# Patient Record
Sex: Female | Born: 1950 | Race: Black or African American | Hispanic: No | Marital: Married | Smoking: Former smoker
Health system: Southern US, Community
[De-identification: ages and names within clinical notes are randomized; demographics above are authoritative.]

## PROBLEM LIST (undated history)

## (undated) DIAGNOSIS — R55 Syncope and collapse: Secondary | ICD-10-CM

## (undated) DIAGNOSIS — Z87898 Personal history of other specified conditions: Secondary | ICD-10-CM

## (undated) DIAGNOSIS — Q211 Atrial septal defect, unspecified: Secondary | ICD-10-CM

## (undated) HISTORY — PX: ABDOMINAL HYSTERECTOMY: SHX81

## (undated) HISTORY — DX: Syncope and collapse: R55

## (undated) HISTORY — DX: Atrial septal defect, unspecified: Q21.10

## (undated) HISTORY — DX: Personal history of other specified conditions: Z87.898

## (undated) HISTORY — DX: Atrial septal defect: Q21.1

## (undated) HISTORY — PX: NECK SURGERY: SHX720

---

## 1997-08-14 ENCOUNTER — Ambulatory Visit (HOSPITAL_COMMUNITY): Admission: RE | Admit: 1997-08-14 | Discharge: 1997-08-14 | Payer: Self-pay | Admitting: Obstetrics and Gynecology

## 1997-12-01 ENCOUNTER — Inpatient Hospital Stay (HOSPITAL_COMMUNITY): Admission: AD | Admit: 1997-12-01 | Discharge: 1997-12-01 | Payer: Self-pay | Admitting: Obstetrics and Gynecology

## 1997-12-31 ENCOUNTER — Inpatient Hospital Stay (HOSPITAL_COMMUNITY): Admission: RE | Admit: 1997-12-31 | Discharge: 1997-12-31 | Payer: Self-pay | Admitting: Obstetrics and Gynecology

## 1998-01-31 ENCOUNTER — Encounter (HOSPITAL_COMMUNITY): Admission: RE | Admit: 1998-01-31 | Discharge: 1998-03-11 | Payer: Self-pay | Admitting: Obstetrics and Gynecology

## 1998-03-10 ENCOUNTER — Observation Stay (HOSPITAL_COMMUNITY): Admission: RE | Admit: 1998-03-10 | Discharge: 1998-03-11 | Payer: Self-pay | Admitting: Obstetrics and Gynecology

## 1999-01-12 ENCOUNTER — Ambulatory Visit (HOSPITAL_COMMUNITY): Admission: RE | Admit: 1999-01-12 | Discharge: 1999-01-12 | Payer: Self-pay | Admitting: Family Medicine

## 1999-01-14 ENCOUNTER — Encounter: Payer: Self-pay | Admitting: Family Medicine

## 1999-01-14 ENCOUNTER — Ambulatory Visit (HOSPITAL_COMMUNITY): Admission: RE | Admit: 1999-01-14 | Discharge: 1999-01-14 | Payer: Self-pay | Admitting: Family Medicine

## 1999-06-19 ENCOUNTER — Encounter: Payer: Self-pay | Admitting: Family Medicine

## 1999-06-19 ENCOUNTER — Encounter: Admission: RE | Admit: 1999-06-19 | Discharge: 1999-06-19 | Payer: Self-pay | Admitting: Family Medicine

## 2000-01-25 ENCOUNTER — Ambulatory Visit (HOSPITAL_COMMUNITY): Admission: RE | Admit: 2000-01-25 | Discharge: 2000-01-25 | Payer: Self-pay | Admitting: Family Medicine

## 2000-01-25 ENCOUNTER — Encounter: Payer: Self-pay | Admitting: Family Medicine

## 2000-12-15 ENCOUNTER — Encounter: Payer: Self-pay | Admitting: Family Medicine

## 2000-12-15 ENCOUNTER — Encounter: Admission: RE | Admit: 2000-12-15 | Discharge: 2000-12-15 | Payer: Self-pay | Admitting: Family Medicine

## 2001-11-02 ENCOUNTER — Ambulatory Visit (HOSPITAL_COMMUNITY): Admission: RE | Admit: 2001-11-02 | Discharge: 2001-11-02 | Payer: Self-pay | Admitting: Family Medicine

## 2001-11-02 ENCOUNTER — Encounter: Payer: Self-pay | Admitting: Family Medicine

## 2003-04-08 ENCOUNTER — Ambulatory Visit (HOSPITAL_COMMUNITY): Admission: RE | Admit: 2003-04-08 | Discharge: 2003-04-08 | Payer: Self-pay | Admitting: Family Medicine

## 2003-04-08 ENCOUNTER — Encounter: Payer: Self-pay | Admitting: Family Medicine

## 2004-06-02 ENCOUNTER — Ambulatory Visit (HOSPITAL_COMMUNITY): Admission: RE | Admit: 2004-06-02 | Discharge: 2004-06-02 | Payer: Self-pay | Admitting: Family Medicine

## 2004-10-07 ENCOUNTER — Emergency Department (HOSPITAL_COMMUNITY): Admission: EM | Admit: 2004-10-07 | Discharge: 2004-10-07 | Payer: Self-pay | Admitting: Emergency Medicine

## 2004-10-15 ENCOUNTER — Encounter: Admission: RE | Admit: 2004-10-15 | Discharge: 2004-10-15 | Payer: Self-pay | Admitting: Family Medicine

## 2004-10-26 ENCOUNTER — Encounter: Admission: RE | Admit: 2004-10-26 | Discharge: 2004-10-26 | Payer: Self-pay | Admitting: Family Medicine

## 2004-11-09 ENCOUNTER — Inpatient Hospital Stay (HOSPITAL_COMMUNITY): Admission: RE | Admit: 2004-11-09 | Discharge: 2004-11-10 | Payer: Self-pay | Admitting: Neurosurgery

## 2005-06-14 ENCOUNTER — Ambulatory Visit (HOSPITAL_COMMUNITY): Admission: RE | Admit: 2005-06-14 | Discharge: 2005-06-14 | Payer: Self-pay | Admitting: Family Medicine

## 2005-10-27 ENCOUNTER — Encounter: Payer: Self-pay | Admitting: Family Medicine

## 2005-11-11 ENCOUNTER — Encounter: Admission: RE | Admit: 2005-11-11 | Discharge: 2005-11-11 | Payer: Self-pay | Admitting: Family Medicine

## 2006-07-13 ENCOUNTER — Encounter: Admission: RE | Admit: 2006-07-13 | Discharge: 2006-07-13 | Payer: Self-pay | Admitting: Family Medicine

## 2007-02-02 IMAGING — US UNKNOWN US STUDY
1 series · 6 of 6 positions shown · non-contrast
Comparison: none

DG DIAGNOSTIC UNILATERAL L
CC and MLO view(s) were taken of the left breast.

LEFT BREAST ULTRASOUND
DIGITAL UNILATERAL LEFT DIAGNOSTIC MAMMOGRAM AND LEFT BREAST ULTRASOUND:
CLINICAL DATA: 54-year-old with numerous lumps palpated in the 4 o'clock position of the left 
breast.

[Series 1: unknown us study · 6 of 6 slices shown]
[im 1/6]
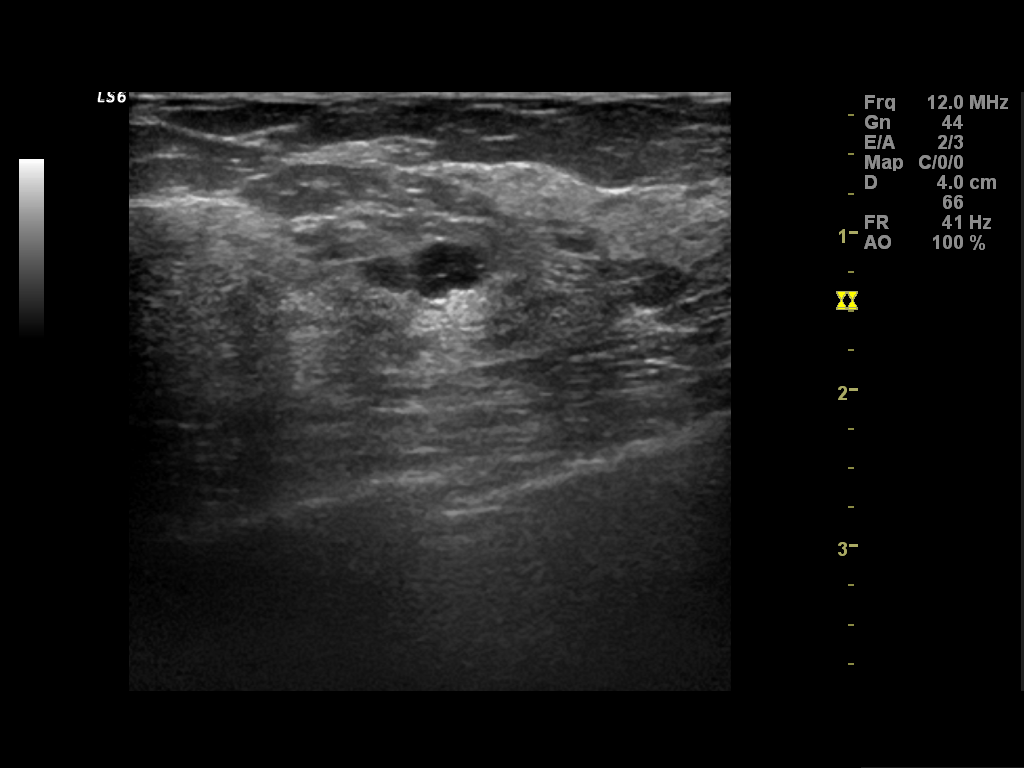
[im 2/6]
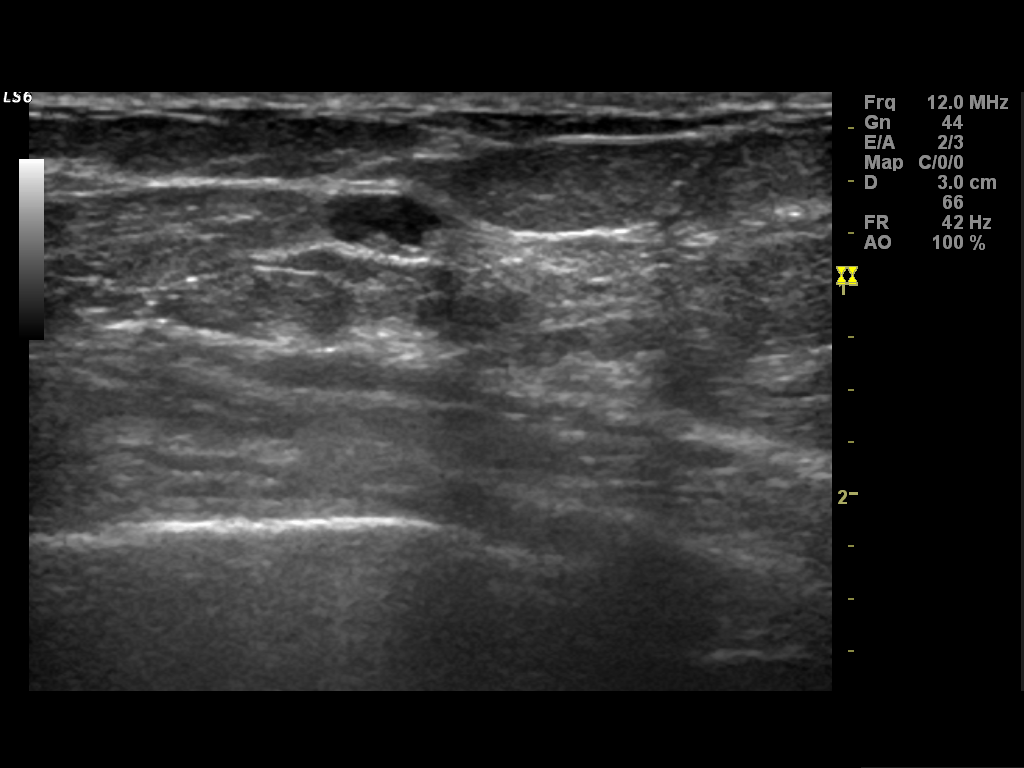
[im 3/6]
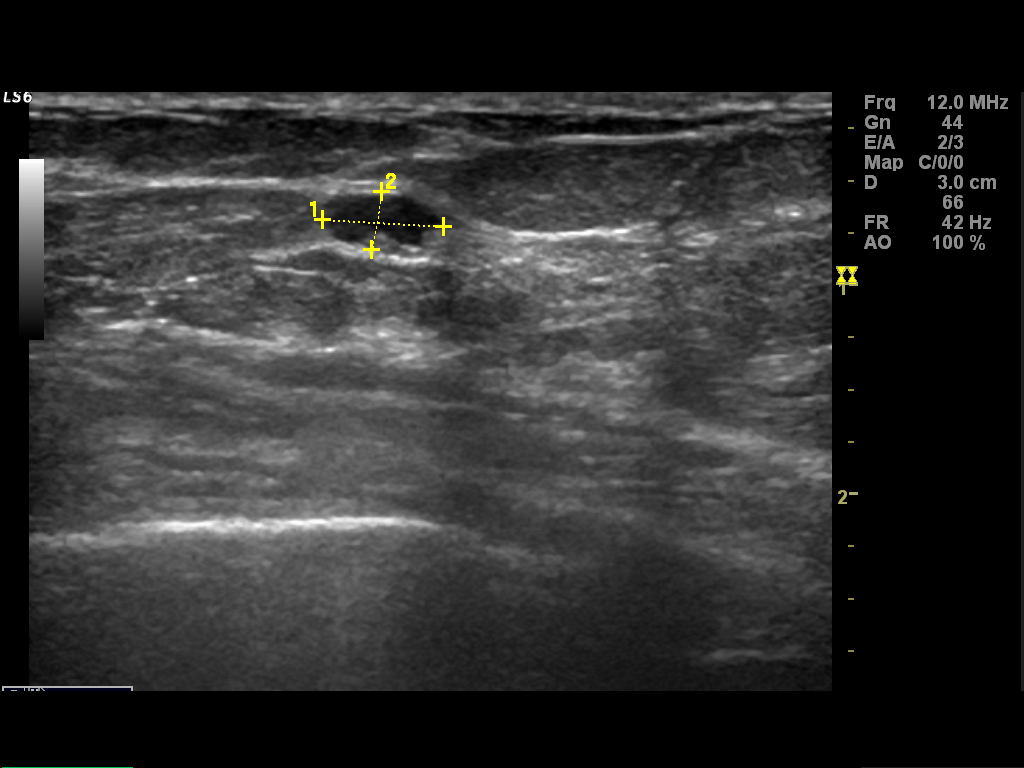
[im 4/6]
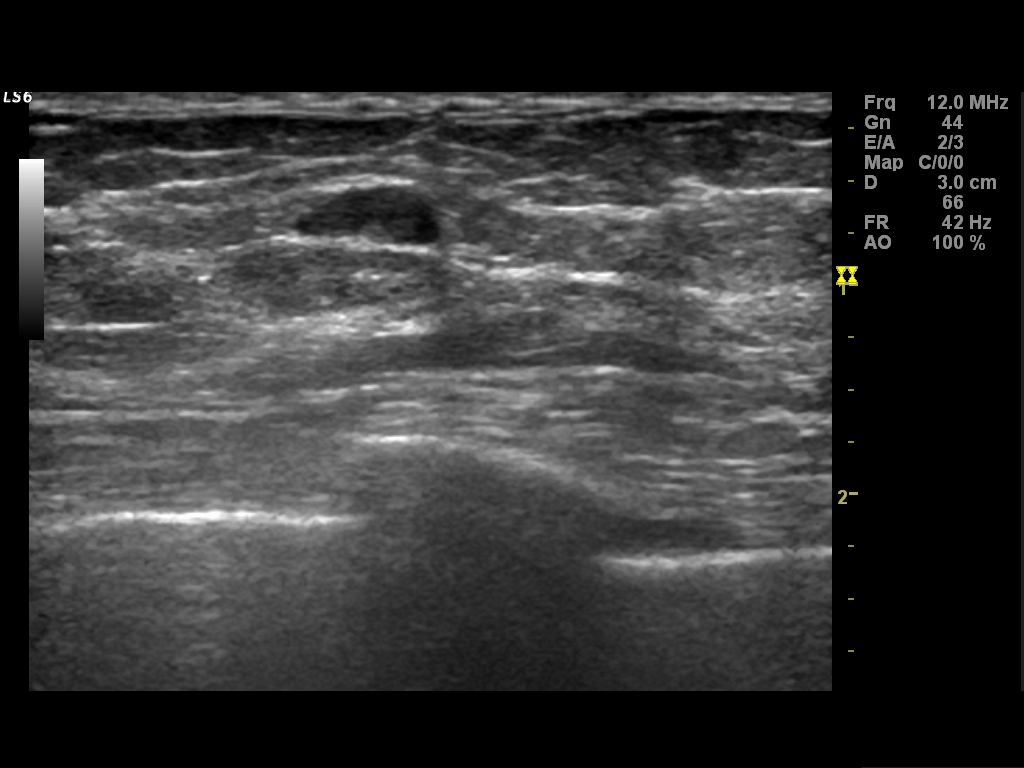
[im 5/6]
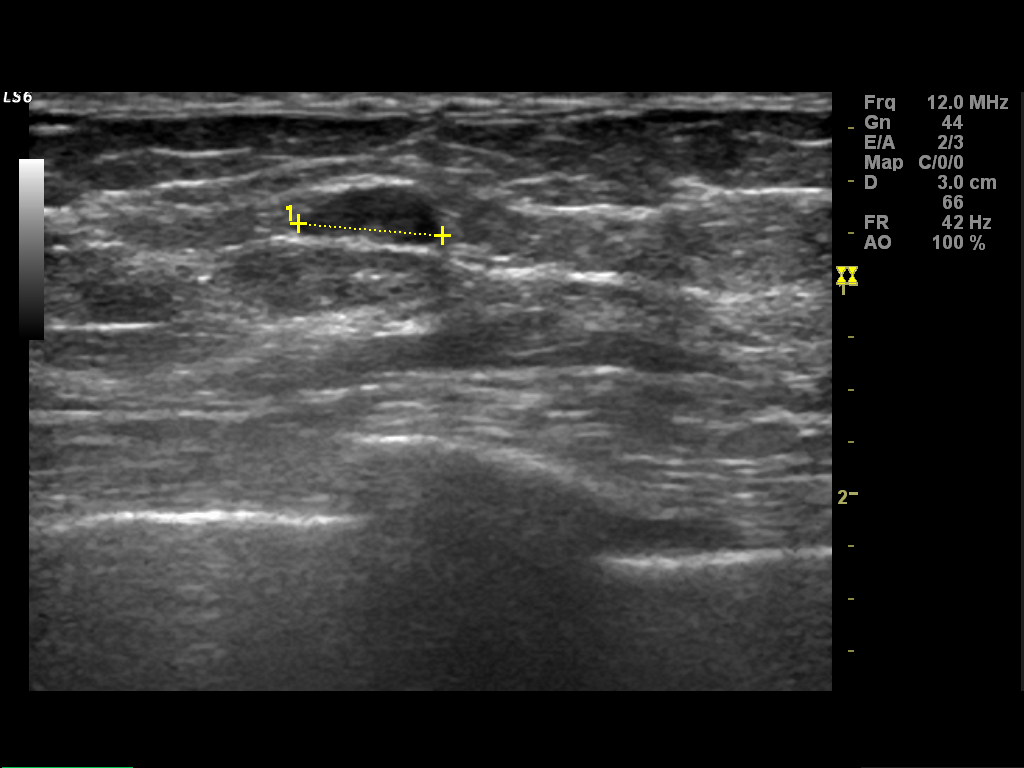
[im 6/6]
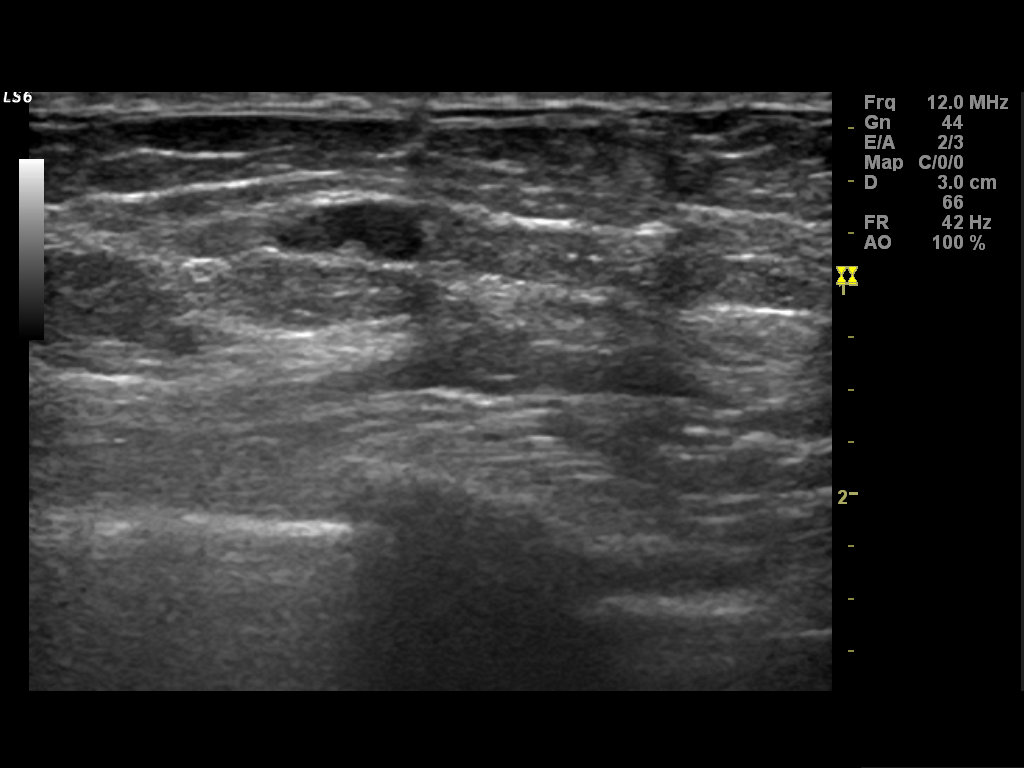

[6 of 6 positions shown; findings below may reference images not displayed]

Comparison 06-02-04.  Breast parenchyma is extremely dense.  A small rounded nodule is seen in the 
medial portion of the left breast, confirmed with spot compression views.  Scattered 
benign-appearing calcifications are noted.

On physical exam, there is soft thickening in the lateral quadrants of the left breast.  I do not 
palpate a discrete abnormality today.  Ultrasound is performed of the lateral quadrants of the left
breast showing normal-appearing fibroglandular parenchyma. Ultrasound is also performed of the 
medial quadrants of the left breast showing a well-defined hypoechoic nodule in the 10 o'clock 
position 7 cm from the nipple measuring 6 x 3 x 7 mm.  This has a small echogenic center and likely
represents an intramammary lymph node.  This correlates well with the mammographic abnormality.   
No suspicious findings are identified.
IMPRESSION: No ultrasound or mammographic evidence for malignancy.  Annual mammography is recommended.  Next 
screening mammogram is suggested in May 2006.

ASSESSMENT: Benign - BI-RADS 2

Screening mammogram of both breasts in 7 months.
,

## 2007-02-02 IMAGING — MG MM DIAGNOSTIC UNILATERAL L
5 series · 5 of 5 positions shown · non-contrast
Comparison: none

DG DIAGNOSTIC UNILATERAL L
CC and MLO view(s) were taken of the left breast.

LEFT BREAST ULTRASOUND
DIGITAL UNILATERAL LEFT DIAGNOSTIC MAMMOGRAM AND LEFT BREAST ULTRASOUND:
CLINICAL DATA: 54-year-old with numerous lumps palpated in the 4 o'clock position of the left 
breast.

[L CC (1 of 2)]
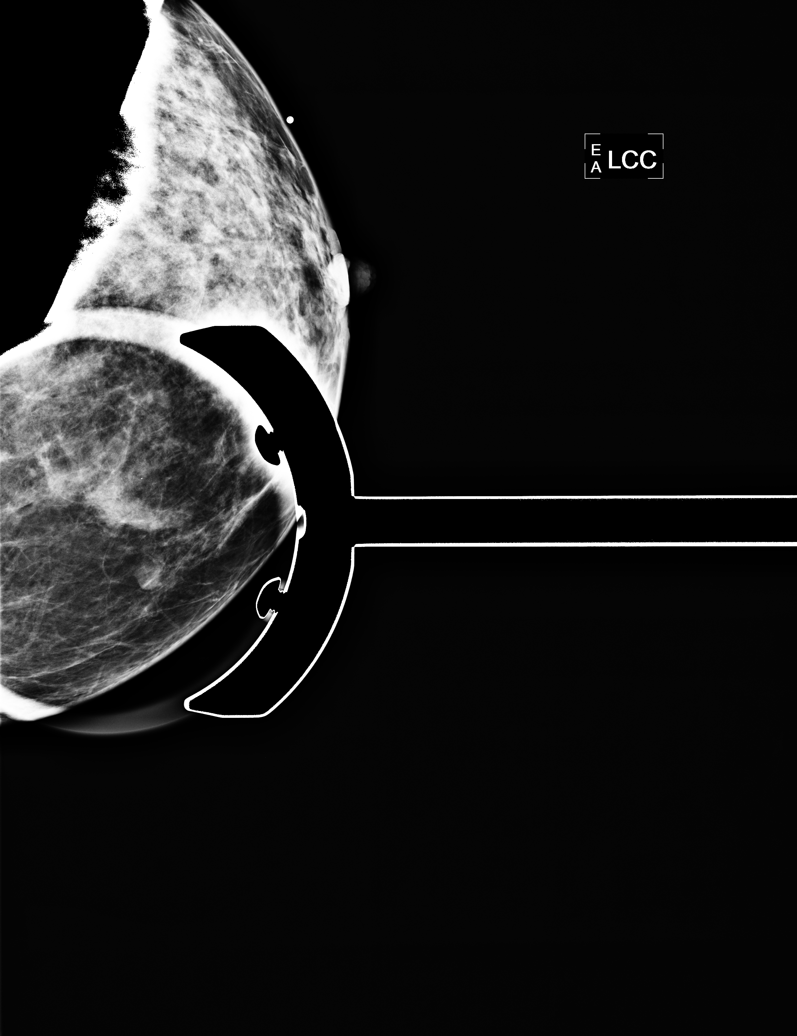

[L CC (2 of 2)]
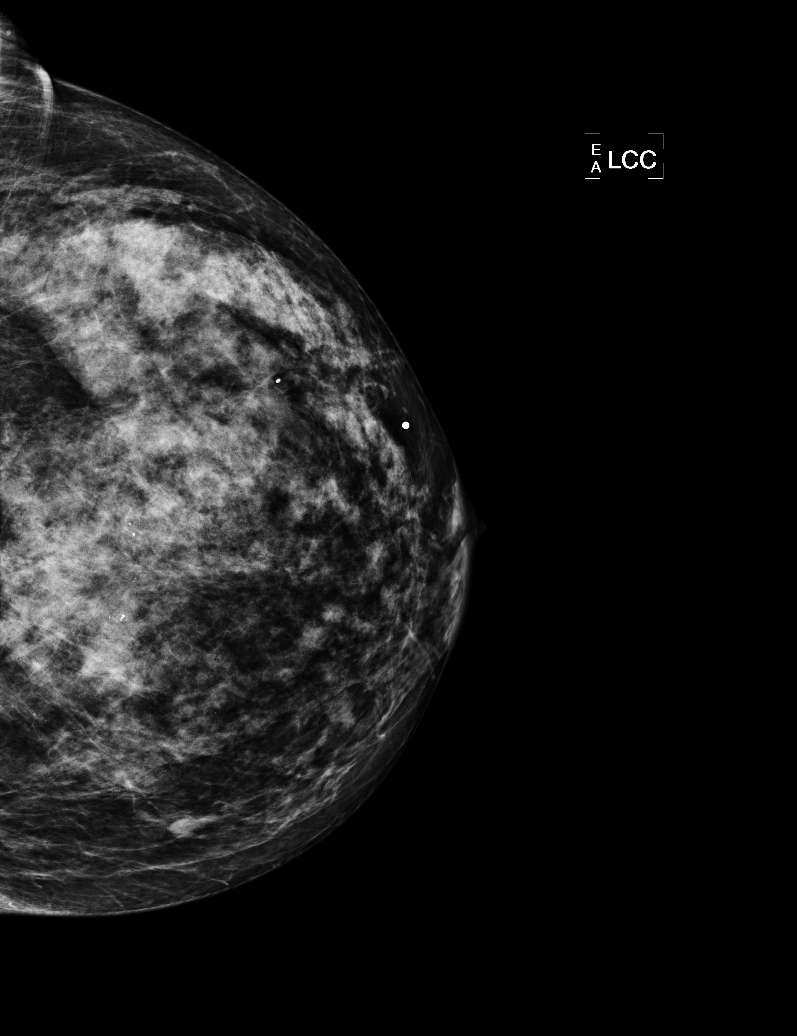

[L MLO]
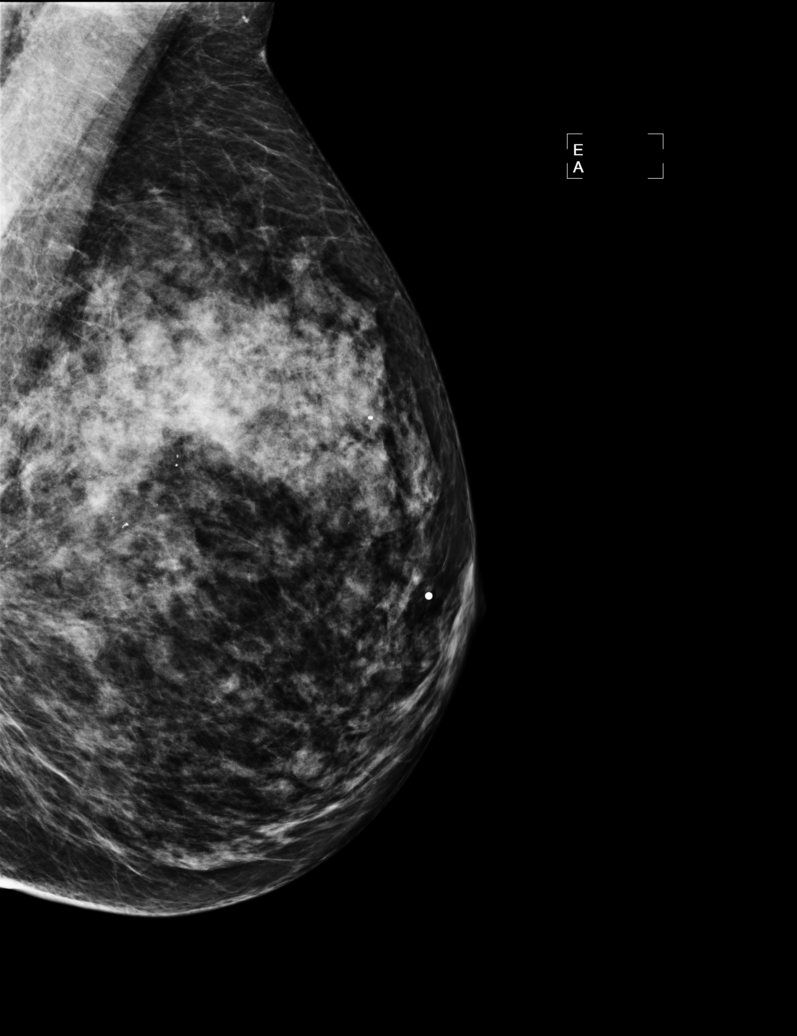

[L TAN]
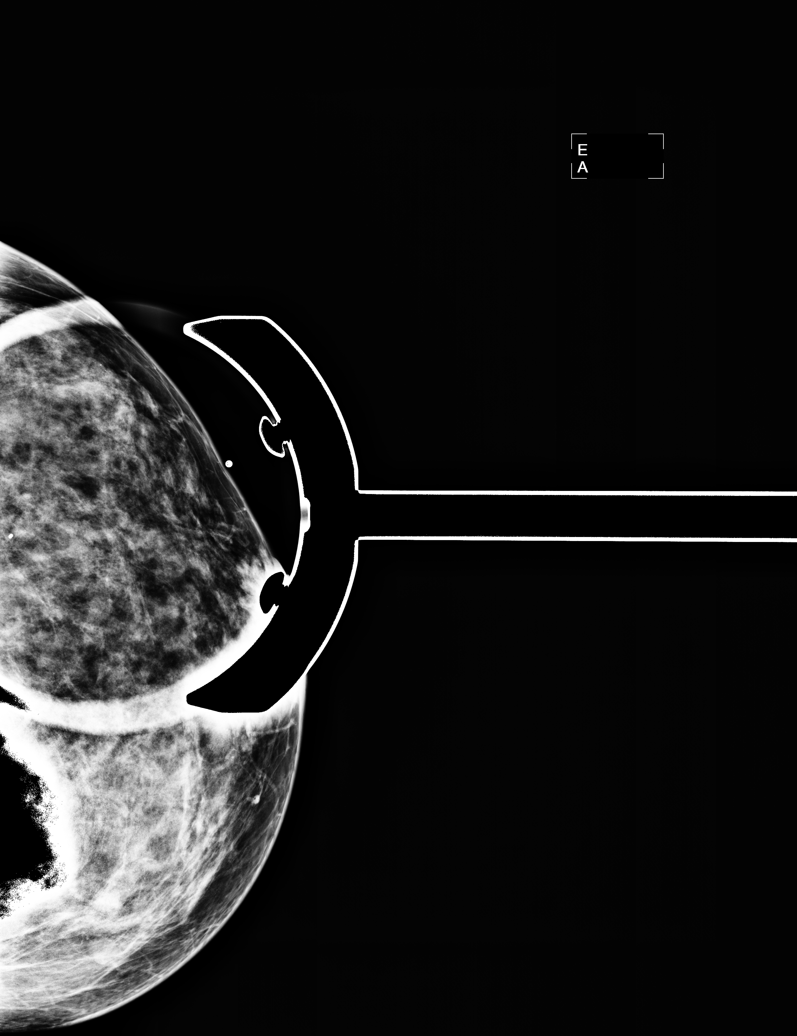

[L ML]
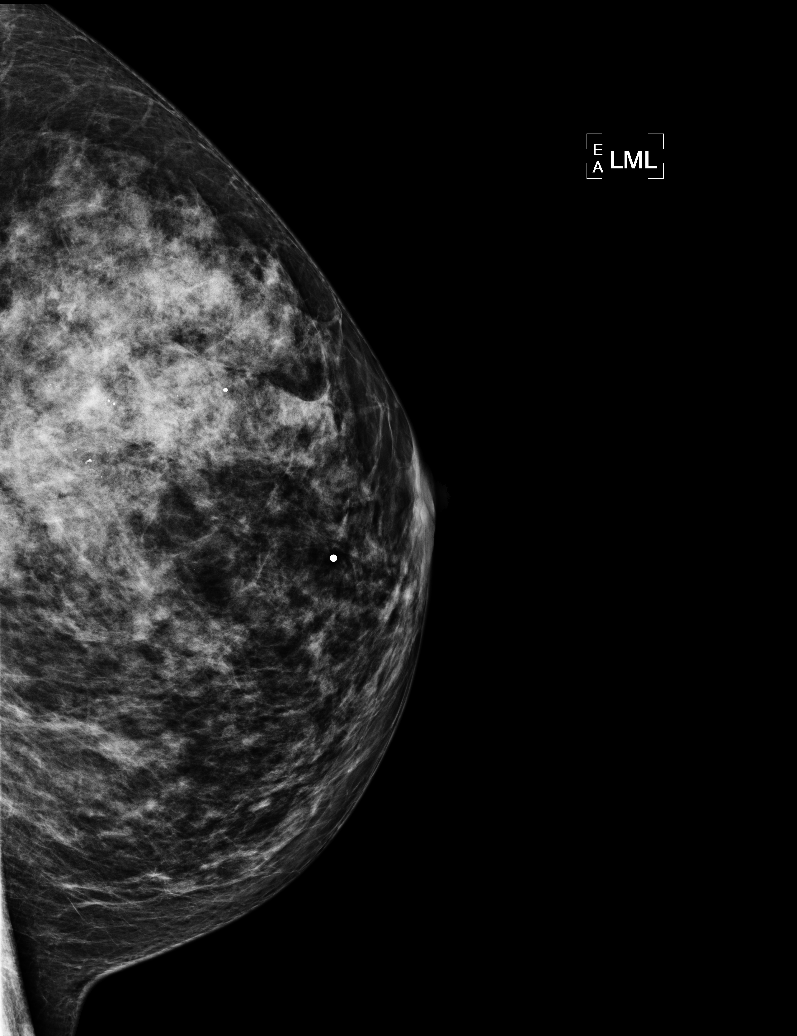

[5 of 5 positions shown; findings below may reference images not displayed]

Comparison 06-02-04.  Breast parenchyma is extremely dense.  A small rounded nodule is seen in the 
medial portion of the left breast, confirmed with spot compression views.  Scattered 
benign-appearing calcifications are noted.

On physical exam, there is soft thickening in the lateral quadrants of the left breast.  I do not 
palpate a discrete abnormality today.  Ultrasound is performed of the lateral quadrants of the left
breast showing normal-appearing fibroglandular parenchyma. Ultrasound is also performed of the 
medial quadrants of the left breast showing a well-defined hypoechoic nodule in the 10 o'clock 
position 7 cm from the nipple measuring 6 x 3 x 7 mm.  This has a small echogenic center and likely
represents an intramammary lymph node.  This correlates well with the mammographic abnormality.   
No suspicious findings are identified.
IMPRESSION: No ultrasound or mammographic evidence for malignancy.  Annual mammography is recommended.  Next 
screening mammogram is suggested in May 2006.

ASSESSMENT: Benign - BI-RADS 2

Screening mammogram of both breasts in 7 months.
,

## 2007-07-31 ENCOUNTER — Ambulatory Visit (HOSPITAL_COMMUNITY): Admission: RE | Admit: 2007-07-31 | Discharge: 2007-07-31 | Payer: Self-pay | Admitting: Family Medicine

## 2008-08-26 ENCOUNTER — Encounter: Admission: RE | Admit: 2008-08-26 | Discharge: 2008-08-26 | Payer: Self-pay | Admitting: Family Medicine

## 2009-05-15 ENCOUNTER — Emergency Department (HOSPITAL_COMMUNITY): Admission: EM | Admit: 2009-05-15 | Discharge: 2009-05-15 | Payer: Self-pay | Admitting: Emergency Medicine

## 2009-10-09 ENCOUNTER — Encounter: Admission: RE | Admit: 2009-10-09 | Discharge: 2009-10-09 | Payer: Self-pay | Admitting: Family Medicine

## 2010-07-07 ENCOUNTER — Encounter
Admission: RE | Admit: 2010-07-07 | Discharge: 2010-07-07 | Payer: Self-pay | Source: Home / Self Care | Attending: Family Medicine | Admitting: Family Medicine

## 2010-07-19 ENCOUNTER — Encounter: Payer: Self-pay | Admitting: Family Medicine

## 2010-09-30 LAB — RAPID STREP SCREEN (MED CTR MEBANE ONLY): Streptococcus, Group A Screen (Direct): NEGATIVE

## 2010-09-30 LAB — CBC
Hemoglobin: 12.1 g/dL (ref 12.0–15.0)
RDW: 12.3 % (ref 11.5–15.5)

## 2010-09-30 LAB — DIFFERENTIAL
Basophils Absolute: 0 10*3/uL (ref 0.0–0.1)
Lymphocytes Relative: 36 % (ref 12–46)
Monocytes Absolute: 0.5 10*3/uL (ref 0.1–1.0)
Neutro Abs: 3.4 10*3/uL (ref 1.7–7.7)

## 2010-09-30 LAB — BASIC METABOLIC PANEL
Calcium: 9.4 mg/dL (ref 8.4–10.5)
GFR calc Af Amer: >60 mL/min (ref >60)
GFR calc non Af Amer: >60 mL/min (ref >60)
Glucose, Bld: 90 mg/dL (ref 70–99)
Sodium: 139 mEq/L (ref 135–145)

## 2010-09-30 LAB — POCT CARDIAC MARKERS
CKMB, poc: 1.5 ng/mL (ref 1.0–8.0)
Troponin i, poc: <0.05 ng/mL (ref 0.00–0.09)

## 2010-10-15 ENCOUNTER — Other Ambulatory Visit: Payer: Self-pay | Admitting: Family Medicine

## 2010-10-15 DIAGNOSIS — Z1231 Encounter for screening mammogram for malignant neoplasm of breast: Secondary | ICD-10-CM

## 2010-10-26 ENCOUNTER — Ambulatory Visit
Admission: RE | Admit: 2010-10-26 | Discharge: 2010-10-26 | Disposition: A | Discharge status: Home / Self Care | Payer: BC Managed Care – PPO | Source: Ambulatory Visit | Attending: Family Medicine | Admitting: Family Medicine

## 2010-10-26 DIAGNOSIS — Z1231 Encounter for screening mammogram for malignant neoplasm of breast: Secondary | ICD-10-CM

## 2010-11-13 NOTE — Op Note (Signed)
NAMEMALON, SIDDALL NO.:  [id number]   MEDICAL RECORD NO.:  [id number]          PATIENT TYPE:  INP   LOCATION:  2899                         FACILITY:  [facility]   PHYSICIAN:  Clydene Fake, M.D.  DATE OF BIRTH:  08-15-1950   DATE OF PROCEDURE:  11/09/2004  DATE OF DISCHARGE:                                 OPERATIVE REPORT   PREOPERATIVE DIAGNOSIS:  Spondylosis, C5-6 and C6-7, with left-sided  radiculopathy.   POSTOPERATIVE DIAGNOSIS:  Spondylosis, C5-6 and C6-7, with left-sided  radiculopathy.   PROCEDURE:  Anterior cervical decompression diskectomy and fusion, C5-6 and  C6-7, with placement of allograft bone and Eagle anterior cervical plate.   SURGEON:  Clydene Fake, M.D.   ASSISTANT:  __________   ANESTHESIA:  General endotracheal tube anesthesia.   ESTIMATED BLOOD LOSS:  150 mL.   DRAINS:  None.   COMPLICATIONS:  None.   REASON FOR CONSULTATION:  The patient is a 60 year old woman who presented  with neck and left arm pain, numbness, and weakness.  This started about a  month ago.  The pain radiates to the left arm, triceps to the middle finger  with numbness in the middle finger.  She was found to have weakness in the  left triceps and maybe a little atrophy there.  The patient's MRI was done  showing spondylitic changes of 5-6 and 6-7 with foraminal narrowing of 6 on  the left with foraminal narrowing and disk herniation of the foramen at 6-7.  The patient was brought in for decompression and fusion.   DESCRIPTION OF PROCEDURE:  The patient was brought into the operating room.  General anesthesia was induced.  The patient was placed in 10 pounds halter  traction and prepped and draped in a sterile fashion.  The site of incision  was injected with 10 mL of 1% lidocaine with epinephrine.  The incision was  then made from the midline to the anterior border of the sternocleidomastoid  muscle on the left side of the neck.  Incision was taken to  the platysma,  and the platysma was incised with the Bovie.  Blunt dissection was taken  through the anterior cervical fascia to the anterior cervical spine.  The  needle was placed in the interspace and x-ray was obtained which showed this  was in the 5-6 interspace.  The disk space was incised, and partial  diskectomy was performed.  As the needle was removed, the longus colli  muscle was reflected laterally on each side over the 5, 6, and 7 vertebral  bodies.  The self-retaining retractor was replaced, and the 5-6 and 6-7 disk  spaces were then incised with the 15-blade, and diskectomy was performed.  With pituitary rongeurs and curets, distraction pins were placed in the C5  and C7 interspaces and distracted.  The microscope was brought in for  microdissection, and continuing with the curets and 1 and 2-mm Kerrison  punches, disk continued to be removed.  Posterior ligament and osteophytes  were removed with the Kerrison punches, and bilateral foraminotomies were  performed at both 5-6 and 6-7  levels.  There was some brief disk herniation  into the foramen on the left side at C6-7.  When we were finished, we had  viewed decompression of the central canal and foramen with decompression of  the nerve roots bilaterally at both levels.  Hemostasis was obtained with  Gelfoam and thrombin, and then Gelfoam was irrigated out.  We did have a  little oozing from the foramen on the left at 6-7, and a small piece of  Gelfoam was left in that corner.  We used a high-speed drill to remove  cartilage endplate to remove anterior osteophytes and measured the  interspace to be 5 mm each.  A 5-mm __________ to bone was tapped into 5-6  and another one into 6-7 and countersunk about 1 mm.  We checked posterior  to the graft, and there was plenty of room between the bone graft and the  dura.  It was irrigated with Keflex solution.  The distraction pins were  removed.  Hemostasis was obtained with Gelfoam  and thrombin.  The weight was  removed from the traction.  The arm was in good position.  The Eagle  anterior cervical plate was placed over the anterior cervical spine and 2  screws placed in C5, 2 in C6, and 2 in C7.  These were tightened down, and  lateral x-rays were obtained showing good position of plate and screws and  interbody bone at 5-6 and 6-7 levels.  Retractors were removed.  Hemostasis  was obtained with Gelfoam, thrombin, and bipolar cauterization.  Gelfoam was  irrigated out using antibiotic solution.  We had good hemostasis, and the  platysma was closed with 3-0 Vicryl interrupted sutures.  The subcutaneous  tissues was closed with the same.  The skin was closed with benzoin and  Steri-Strips.  Dressing was placed.  The patient was placed in a soft  cervical collar, awoke from anesthesia, and transferred to the recovery room  in stable condition.      JRH/MEDQ  D:  11/09/2004  T:  11/09/2004  Job:  [id number]

## 2011-10-27 ENCOUNTER — Other Ambulatory Visit: Payer: Self-pay | Admitting: Family Medicine

## 2011-10-27 DIAGNOSIS — Z1231 Encounter for screening mammogram for malignant neoplasm of breast: Secondary | ICD-10-CM

## 2011-11-08 ENCOUNTER — Ambulatory Visit
Admission: RE | Admit: 2011-11-08 | Discharge: 2011-11-08 | Disposition: A | Discharge status: Home / Self Care | Payer: BC Managed Care – PPO | Source: Ambulatory Visit | Attending: Family Medicine | Admitting: Family Medicine

## 2011-11-08 DIAGNOSIS — Z1231 Encounter for screening mammogram for malignant neoplasm of breast: Secondary | ICD-10-CM

## 2012-06-16 ENCOUNTER — Other Ambulatory Visit: Payer: Self-pay | Admitting: Physician Assistant

## 2012-06-16 DIAGNOSIS — M79609 Pain in unspecified limb: Secondary | ICD-10-CM

## 2012-06-19 ENCOUNTER — Ambulatory Visit
Admission: RE | Admit: 2012-06-19 | Discharge: 2012-06-19 | Disposition: A | Discharge status: Home / Self Care | Payer: BC Managed Care – PPO | Source: Ambulatory Visit | Attending: Physician Assistant | Admitting: Physician Assistant

## 2012-06-19 DIAGNOSIS — M79609 Pain in unspecified limb: Secondary | ICD-10-CM

## 2012-11-27 ENCOUNTER — Other Ambulatory Visit: Payer: Self-pay | Admitting: *Deleted

## 2012-11-27 DIAGNOSIS — I83893 Varicose veins of bilateral lower extremities with other complications: Secondary | ICD-10-CM

## 2012-12-18 ENCOUNTER — Encounter: Payer: Self-pay | Admitting: Vascular Surgery

## 2012-12-18 ENCOUNTER — Other Ambulatory Visit: Payer: Self-pay

## 2012-12-18 DIAGNOSIS — Z1231 Encounter for screening mammogram for malignant neoplasm of breast: Secondary | ICD-10-CM

## 2012-12-19 ENCOUNTER — Encounter: Payer: Self-pay | Admitting: Vascular Surgery

## 2012-12-19 ENCOUNTER — Ambulatory Visit (INDEPENDENT_AMBULATORY_CARE_PROVIDER_SITE_OTHER): Payer: BC Managed Care – PPO | Admitting: Vascular Surgery

## 2012-12-19 ENCOUNTER — Encounter (INDEPENDENT_AMBULATORY_CARE_PROVIDER_SITE_OTHER): Payer: BC Managed Care – PPO | Admitting: Vascular Surgery

## 2012-12-19 ENCOUNTER — Other Ambulatory Visit: Payer: Self-pay

## 2012-12-19 VITALS — BP 147/71 | HR 66 | Resp 16 | Ht 65.5 in | Wt 120.0 lb

## 2012-12-19 DIAGNOSIS — M79609 Pain in unspecified limb: Secondary | ICD-10-CM

## 2012-12-19 DIAGNOSIS — I83893 Varicose veins of bilateral lower extremities with other complications: Secondary | ICD-10-CM

## 2012-12-19 DIAGNOSIS — I868 Varicose veins of other specified sites: Secondary | ICD-10-CM

## 2012-12-19 DIAGNOSIS — I8390 Asymptomatic varicose veins of unspecified lower extremity: Secondary | ICD-10-CM

## 2012-12-19 NOTE — Progress Notes (Signed)
Subjective:     Patient ID: Morgan Bright, female   DOB: [birth year], 62 y.o.   MRN: [id number]  HPI this 62 year old healthy female has noticed some spider veins in both lower extremities. This is associated with stinging discomfort. She will occasionally have swelling in the ankle. She has no history of DVT and thrombophlebitis. She does not wear elastic compression stockings were had history of stasis ulcers.  Past Medical History  Diagnosis Date  . ASD (atrial septal defect)     History  Substance Use Topics  . Smoking status: Never Smoker   . Smokeless tobacco: Not on file  . Alcohol Use: Yes    Family History  Problem Relation Age of Onset  . Cancer Mother   . Hyperlipidemia Mother   . Hypertension Mother   . Cancer Father   . Diabetes Father   . Cancer Brother     Allergies  Allergen Reactions  . Benadryl (Diphenhydramine Hcl)     No current outpatient prescriptions on file.  BP 147/71  Pulse 66  Resp 16  Ht 5' 5.5" (1.664 m)  Wt 120 lb (54.432 kg)  BMI 19.66 kg/m2  Body mass index is 19.66 kg/(m^2).          Review of Systems denies chest pain, dyspnea on exertion, PND, orthopnea, bronchitis, hemoptysis. He does complain of weakness in arms and legs at times.    Objective:   Physical Exam blood pressure 147/71 heart rate 66 respirations 16 Gen.-alert and oriented x3 in no apparent distress HEENT normal for age Lungs no rhonchi or wheezing Cardiovascular regular rhythm no murmurs carotid pulses 3+ palpable no bruits audible Abdomen soft nontender no palpable masses Musculoskeletal free of  major deformities Skin clear -no rashes Neurologic normal Lower extremities 3+ femoral and dorsalis pedis pulses palpable bilaterally with no edema Both legs with scattered spider veins lateral thigh and medial thigh and lateral calf area. No hyperpigmentation or ulceration noted.  Then ordered bilateral venous duplex exam which I reviewed and interpreted.  There is no DVT. There is no reflux in the superficial system.       Assessment:     Bilateral spider veins with no significant reflux or obstruction    Plan:     The patient will consider sclerotherapy

## 2012-12-21 ENCOUNTER — Encounter: Payer: Self-pay | Admitting: Family Medicine

## 2012-12-26 ENCOUNTER — Ambulatory Visit
Admission: RE | Admit: 2012-12-26 | Discharge: 2012-12-26 | Disposition: A | Discharge status: Home / Self Care | Payer: BC Managed Care – PPO | Source: Ambulatory Visit

## 2012-12-26 DIAGNOSIS — Z1231 Encounter for screening mammogram for malignant neoplasm of breast: Secondary | ICD-10-CM

## 2012-12-28 ENCOUNTER — Other Ambulatory Visit: Payer: Self-pay | Admitting: Physician Assistant

## 2012-12-28 DIAGNOSIS — R928 Other abnormal and inconclusive findings on diagnostic imaging of breast: Secondary | ICD-10-CM

## 2013-01-08 ENCOUNTER — Encounter: Payer: BC Managed Care – PPO | Admitting: Vascular Surgery

## 2013-01-11 ENCOUNTER — Ambulatory Visit
Admission: RE | Admit: 2013-01-11 | Discharge: 2013-01-11 | Disposition: A | Discharge status: Home / Self Care | Payer: BC Managed Care – PPO | Source: Ambulatory Visit | Attending: Physician Assistant | Admitting: Physician Assistant

## 2013-01-11 ENCOUNTER — Other Ambulatory Visit: Payer: Self-pay | Admitting: Radiology

## 2013-01-11 DIAGNOSIS — R928 Other abnormal and inconclusive findings on diagnostic imaging of breast: Secondary | ICD-10-CM

## 2013-01-11 HISTORY — PX: BREAST BIOPSY: SHX20

## 2014-02-04 ENCOUNTER — Other Ambulatory Visit: Payer: Self-pay

## 2014-02-04 DIAGNOSIS — Z1231 Encounter for screening mammogram for malignant neoplasm of breast: Secondary | ICD-10-CM

## 2014-02-13 ENCOUNTER — Ambulatory Visit
Admission: RE | Admit: 2014-02-13 | Discharge: 2014-02-13 | Disposition: A | Discharge status: Home / Self Care | Payer: BC Managed Care – PPO | Source: Ambulatory Visit

## 2014-02-13 DIAGNOSIS — Z1231 Encounter for screening mammogram for malignant neoplasm of breast: Secondary | ICD-10-CM

## 2014-05-17 ENCOUNTER — Ambulatory Visit (HOSPITAL_COMMUNITY)
Admission: RE | Admit: 2014-05-17 | Discharge: 2014-05-17 | Disposition: A | Discharge status: Home / Self Care | Payer: Medicare Other | Source: Ambulatory Visit | Attending: Family Medicine | Admitting: Family Medicine

## 2014-05-17 ENCOUNTER — Other Ambulatory Visit (HOSPITAL_COMMUNITY): Payer: Self-pay | Admitting: Family Medicine

## 2014-05-17 DIAGNOSIS — M545 Low back pain, unspecified: Secondary | ICD-10-CM

## 2014-05-17 DIAGNOSIS — Z981 Arthrodesis status: Secondary | ICD-10-CM | POA: Insufficient documentation

## 2014-05-17 DIAGNOSIS — M542 Cervicalgia: Secondary | ICD-10-CM | POA: Diagnosis present

## 2014-05-17 DIAGNOSIS — M25551 Pain in right hip: Secondary | ICD-10-CM | POA: Insufficient documentation

## 2014-05-17 DIAGNOSIS — M47812 Spondylosis without myelopathy or radiculopathy, cervical region: Secondary | ICD-10-CM

## 2014-05-17 DIAGNOSIS — M25511 Pain in right shoulder: Secondary | ICD-10-CM | POA: Diagnosis present

## 2014-07-04 ENCOUNTER — Ambulatory Visit: Payer: Medicare Other | Attending: Neurosurgery | Admitting: Physical Therapy

## 2014-07-04 DIAGNOSIS — M545 Low back pain: Secondary | ICD-10-CM | POA: Insufficient documentation

## 2014-07-04 DIAGNOSIS — M5116 Intervertebral disc disorders with radiculopathy, lumbar region: Secondary | ICD-10-CM | POA: Insufficient documentation

## 2014-07-09 ENCOUNTER — Ambulatory Visit: Payer: Medicare Other | Admitting: Physical Therapy

## 2014-07-09 DIAGNOSIS — M545 Low back pain: Secondary | ICD-10-CM | POA: Diagnosis not present

## 2014-07-11 ENCOUNTER — Ambulatory Visit: Payer: Medicare Other | Admitting: Physical Therapy

## 2014-07-11 DIAGNOSIS — M545 Low back pain: Secondary | ICD-10-CM | POA: Diagnosis not present

## 2014-07-15 ENCOUNTER — Ambulatory Visit: Payer: Medicare Other | Admitting: Physical Therapy

## 2014-07-15 DIAGNOSIS — M545 Low back pain: Secondary | ICD-10-CM | POA: Diagnosis not present

## 2014-07-19 ENCOUNTER — Ambulatory Visit: Payer: Medicare Other | Admitting: Physical Therapy

## 2014-07-22 ENCOUNTER — Ambulatory Visit: Payer: Medicare Other | Admitting: Physical Therapy

## 2014-07-22 DIAGNOSIS — M545 Low back pain: Secondary | ICD-10-CM | POA: Diagnosis not present

## 2014-07-31 ENCOUNTER — Ambulatory Visit: Payer: Medicare Other | Attending: Neurosurgery | Admitting: Physical Therapy

## 2014-07-31 DIAGNOSIS — M545 Low back pain: Secondary | ICD-10-CM | POA: Diagnosis present

## 2014-07-31 DIAGNOSIS — M5116 Intervertebral disc disorders with radiculopathy, lumbar region: Secondary | ICD-10-CM | POA: Diagnosis not present

## 2014-08-05 ENCOUNTER — Ambulatory Visit: Payer: Medicare Other | Admitting: Physical Therapy

## 2014-08-05 DIAGNOSIS — M545 Low back pain: Secondary | ICD-10-CM | POA: Diagnosis not present

## 2014-08-07 ENCOUNTER — Ambulatory Visit: Payer: Medicare Other | Admitting: Physical Therapy

## 2014-08-07 DIAGNOSIS — M545 Low back pain: Secondary | ICD-10-CM | POA: Diagnosis not present

## 2015-02-17 ENCOUNTER — Other Ambulatory Visit: Payer: Self-pay

## 2015-02-17 DIAGNOSIS — Z1231 Encounter for screening mammogram for malignant neoplasm of breast: Secondary | ICD-10-CM

## 2015-03-26 ENCOUNTER — Ambulatory Visit
Admission: RE | Admit: 2015-03-26 | Discharge: 2015-03-26 | Disposition: A | Discharge status: Home / Self Care | Payer: Medicare Other | Source: Ambulatory Visit

## 2015-03-26 DIAGNOSIS — Z1231 Encounter for screening mammogram for malignant neoplasm of breast: Secondary | ICD-10-CM

## 2015-05-14 ENCOUNTER — Other Ambulatory Visit: Payer: Self-pay | Admitting: Family Medicine

## 2015-05-14 DIAGNOSIS — M545 Low back pain: Secondary | ICD-10-CM

## 2015-05-14 DIAGNOSIS — M79604 Pain in right leg: Secondary | ICD-10-CM

## 2015-05-14 DIAGNOSIS — R200 Anesthesia of skin: Secondary | ICD-10-CM

## 2015-05-14 DIAGNOSIS — M79605 Pain in left leg: Secondary | ICD-10-CM

## 2015-05-14 DIAGNOSIS — G8929 Other chronic pain: Secondary | ICD-10-CM

## 2015-06-09 ENCOUNTER — Ambulatory Visit
Admission: RE | Admit: 2015-06-09 | Discharge: 2015-06-09 | Disposition: A | Discharge status: Home / Self Care | Payer: Medicare Other | Source: Ambulatory Visit | Attending: Family Medicine | Admitting: Family Medicine

## 2015-06-09 DIAGNOSIS — G8929 Other chronic pain: Secondary | ICD-10-CM

## 2015-06-09 DIAGNOSIS — R200 Anesthesia of skin: Secondary | ICD-10-CM

## 2015-06-09 DIAGNOSIS — M79604 Pain in right leg: Secondary | ICD-10-CM

## 2015-06-09 DIAGNOSIS — M545 Low back pain: Secondary | ICD-10-CM

## 2015-06-09 DIAGNOSIS — M79605 Pain in left leg: Secondary | ICD-10-CM

## 2016-04-07 ENCOUNTER — Other Ambulatory Visit: Payer: Self-pay | Admitting: Family Medicine

## 2016-04-07 DIAGNOSIS — Z1231 Encounter for screening mammogram for malignant neoplasm of breast: Secondary | ICD-10-CM

## 2016-04-28 ENCOUNTER — Ambulatory Visit: Payer: Medicare Other

## 2016-05-27 ENCOUNTER — Ambulatory Visit
Admission: RE | Admit: 2016-05-27 | Discharge: 2016-05-27 | Disposition: A | Discharge status: Home / Self Care | Payer: Medicare Other | Source: Ambulatory Visit | Attending: Family Medicine | Admitting: Family Medicine

## 2016-05-27 DIAGNOSIS — Z1231 Encounter for screening mammogram for malignant neoplasm of breast: Secondary | ICD-10-CM

## 2017-04-25 ENCOUNTER — Other Ambulatory Visit: Payer: Self-pay | Admitting: Family Medicine

## 2017-04-25 DIAGNOSIS — Z1231 Encounter for screening mammogram for malignant neoplasm of breast: Secondary | ICD-10-CM

## 2017-05-30 ENCOUNTER — Ambulatory Visit
Admission: RE | Admit: 2017-05-30 | Discharge: 2017-05-30 | Disposition: A | Discharge status: Home / Self Care | Payer: Medicare Other | Source: Ambulatory Visit | Attending: Family Medicine | Admitting: Family Medicine

## 2017-05-30 DIAGNOSIS — Z1231 Encounter for screening mammogram for malignant neoplasm of breast: Secondary | ICD-10-CM

## 2018-01-31 ENCOUNTER — Other Ambulatory Visit: Payer: Self-pay | Admitting: Family Medicine

## 2018-01-31 DIAGNOSIS — N644 Mastodynia: Secondary | ICD-10-CM

## 2018-02-06 ENCOUNTER — Ambulatory Visit: Payer: Medicare Other

## 2018-02-06 ENCOUNTER — Ambulatory Visit
Admission: RE | Admit: 2018-02-06 | Discharge: 2018-02-06 | Disposition: A | Discharge status: Home / Self Care | Payer: Medicare Other | Source: Ambulatory Visit | Attending: Family Medicine | Admitting: Family Medicine

## 2018-02-06 DIAGNOSIS — N644 Mastodynia: Secondary | ICD-10-CM

## 2018-07-04 ENCOUNTER — Other Ambulatory Visit: Payer: Self-pay | Admitting: Family Medicine

## 2018-07-04 DIAGNOSIS — Z1231 Encounter for screening mammogram for malignant neoplasm of breast: Secondary | ICD-10-CM

## 2018-08-01 ENCOUNTER — Ambulatory Visit
Admission: RE | Admit: 2018-08-01 | Discharge: 2018-08-01 | Disposition: A | Discharge status: Home / Self Care | Payer: Medicare Other | Source: Ambulatory Visit | Attending: Family Medicine | Admitting: Family Medicine

## 2018-08-01 DIAGNOSIS — Z1231 Encounter for screening mammogram for malignant neoplasm of breast: Secondary | ICD-10-CM

## 2019-08-13 ENCOUNTER — Other Ambulatory Visit: Payer: Self-pay | Admitting: Family Medicine

## 2019-08-13 DIAGNOSIS — N644 Mastodynia: Secondary | ICD-10-CM

## 2019-08-23 ENCOUNTER — Ambulatory Visit
Admission: RE | Admit: 2019-08-23 | Discharge: 2019-08-23 | Disposition: A | Discharge status: Home / Self Care | Payer: Medicare Other | Source: Ambulatory Visit | Attending: Family Medicine | Admitting: Family Medicine

## 2019-08-23 ENCOUNTER — Other Ambulatory Visit: Payer: Self-pay

## 2019-08-23 DIAGNOSIS — N644 Mastodynia: Secondary | ICD-10-CM

## 2020-07-15 ENCOUNTER — Other Ambulatory Visit: Payer: Self-pay | Admitting: Family Medicine

## 2020-07-15 DIAGNOSIS — Z1231 Encounter for screening mammogram for malignant neoplasm of breast: Secondary | ICD-10-CM

## 2020-07-23 ENCOUNTER — Other Ambulatory Visit: Payer: Self-pay

## 2020-07-23 ENCOUNTER — Ambulatory Visit: Payer: Medicare PPO | Admitting: Cardiology

## 2020-07-23 ENCOUNTER — Encounter: Payer: Self-pay | Admitting: Cardiology

## 2020-07-23 ENCOUNTER — Inpatient Hospital Stay: Payer: Medicare PPO

## 2020-07-23 VITALS — BP 164/84 | HR 74 | Temp 98.3°F | Ht 65.5 in | Wt 109.0 lb

## 2020-07-23 DIAGNOSIS — R55 Syncope and collapse: Secondary | ICD-10-CM

## 2020-07-23 DIAGNOSIS — G40409 Other generalized epilepsy and epileptic syndromes, not intractable, without status epilepticus: Secondary | ICD-10-CM

## 2020-07-23 DIAGNOSIS — R030 Elevated blood-pressure reading, without diagnosis of hypertension: Secondary | ICD-10-CM

## 2020-07-23 NOTE — Progress Notes (Signed)
Primary Physician/Referring:  Bartholome Bill, MD  Patient ID: Morgan Bright, female    DOB: [birth year], 70 y.o.   MRN: [id number]  Chief Complaint  Patient presents with  . Loss of Consciousness  . Coronary Artery Disease   HPI:    Morgan Bright  is a 70 y.o.  African-American female with no significant prior cardiovascular history, while sitting on a chair waiting for her friend at the [facility] on 07/11/2020 had sudden onset syncope. This lasted a few seconds, after which the patient had an episode of emesis and EMS was refused by patient and went home had another episode of emesis and was then evaluated by PCP and referral made to me for evaluation of syncope.  She has recently been told to watch her blood pressure as it has been elevated recently.  On further questioning, patient had abdominal discomfort prior to the episode of syncope.  However she also gives me history that in 1995 she has had 1 episode of grand mal seizure.  She was recommended antiseizure medications but she did not start.  She states that she has had few episodes of seizures occasionally and the last episode was 3 months ago while in bed sleeping, she made a grunting noise that woke her husband up and also her sister-in-law in the next room and witnessed that she had rolled up her eyes and was unresponsive for a short time.  Prior to this her last episode was about 3 to 4 years ago.  Patient states that the episode that happened recently on 07/11/2020 was clearly different and she was aware of the surroundings after she woke up from brief fainting spell.  Otherwise she remains active, denies chest pain or dyspnea, she has lost weight and states that she has been under extreme stress recently with multiple members and family getting sick as well.  Past Medical History:  Diagnosis Date  . ASD (atrial septal defect)    Past Surgical History:  Procedure Laterality Date  . ABDOMINAL HYSTERECTOMY    .  NECK SURGERY     Family History  Problem Relation Age of Onset  . Cancer Mother   . Hyperlipidemia Mother   . Hypertension Mother   . Breast cancer Mother 7  . Cancer Father   . Diabetes Father   . Cancer Brother   . Breast cancer Maternal Aunt        late 61's    Social History   Tobacco Use  . Smoking status: Never Smoker  . Smokeless tobacco: Never Used  Substance Use Topics  . Alcohol use: Yes   Marital Status: Married  ROS  Review of Systems  Cardiovascular: Positive for syncope. Negative for chest pain, dyspnea on exertion and leg swelling.  Gastrointestinal: Negative for melena.  Neurological: Positive for seizures (First episode 1995).   Objective  Blood pressure (!) 164/84, pulse 74, temperature 98.3 F (36.8 C), height 5' 5.5" (1.664 m), weight 109 lb (49.4 kg), SpO2 99 %.  Vitals with BMI 07/23/2020 12/19/2012  Height 5' 5.5" 5' 5.5"  Weight 109 lbs 120 lbs  BMI 88.50 27.7  Systolic 412 878  Diastolic 84 71  Pulse 74 66     Physical Exam Constitutional:      Comments: Petite  Cardiovascular:     Rate and Rhythm: Normal rate and regular rhythm.     Pulses: Intact distal pulses.     Heart sounds: Normal heart sounds. No murmur heard. No  gallop.      Comments: No leg edema, no JVD. Pulmonary:     Effort: Pulmonary effort is normal.     Breath sounds: Normal breath sounds.  Abdominal:     General: Bowel sounds are normal.     Palpations: Abdomen is soft.  Musculoskeletal:        General: Normal range of motion.  Skin:    General: Skin is warm and dry.  Neurological:     General: No focal deficit present.     Mental Status: She is alert and oriented to person, place, and time.    Laboratory examination:   No results for input(s): NA, K, CL, CO2, GLUCOSE, BUN, CREATININE, CALCIUM, GFRNONAA, GFRAA in the last 8760 hours. CrCl cannot be calculated (Patient's most recent lab result is older than the maximum 21 days allowed.).  CMP Latest Ref Rng &  Units 05/15/2009  Glucose 70 - 99 mg/dL 90  BUN 6 - 23 mg/dL 11  Creatinine 0.4 - 1.2 mg/dL 0.74  Sodium 135 - 145 mEq/L 139  Potassium 3.5 - 5.1 mEq/L 3.6  Chloride 96 - 112 mEq/L 103  CO2 19 - 32 mEq/L 29  Calcium 8.4 - 10.5 mg/dL 9.4   CBC Latest Ref Rng & Units 05/15/2009  WBC 4.0 - 10.5 K/uL 6.2  Hemoglobin 12.0 - 15.0 g/dL 12.1  Hematocrit 36.0 - 46.0 % 35.0(L)  Platelets 150 - 400 K/uL 215    Lipid Panel No results for input(s): CHOL, TRIG, LDLCALC, VLDL, HDL, CHOLHDL, LDLDIRECT in the last 8760 hours.  HEMOGLOBIN A1C No results found for: HGBA1C, MPG TSH No results for input(s): TSH in the last 8760 hours.  External labs:   Labs 07/11/2020:  Sodium 141, potassium 3.4, BUN 12, creatinine 0.56, EGFR >60 mL, CMP otherwise normal.  Serum glucose 106 mg.  TSH normal.  A1c 4.8%.  Hb 12.5/HCT 37.6, WBC 6.0, platelets 252, microcytic indicis.  RBC folate and B12 normal.  Total cholesterol 243, triglycerides 118, HDL 117, LDL 102.  Non-HDL cholesterol 126.  Medications and allergies   Allergies  Allergen Reactions  . Benadryl [Diphenhydramine Hcl]      Current Meds  Medication Sig  . Cholecalciferol (D3-1000) 25 MCG (1000 UT) capsule Take 1,000 Units by mouth daily.  . cyanocobalamin 1000 MCG tablet Take by mouth.  . Multiple Vitamin (MULTI-VITAMIN) tablet Take 1 tablet by mouth daily.  . [DISCONTINUED] calcium carbonate (OS-CAL) 1250 (500 Ca) MG chewable tablet 1 capsule   Radiology:   No results found.  Cardiac Studies:   Segmental pressure lower extremity 06/19/2012: Findings: The right ankle brachial index is 1.04. The right toe  brachial index is 0.59. Triphasic Doppler waveforms in the right  femoral, right popliteal and right posterior tibial arteries.  Biphasic waveforms in the right dorsalis pedis artery.   The left ankle brachial index is 1.13. The left toe brachial index  is 0.54. There are normal triphasic Doppler waveforms throughout   the left lower extremity. Normal pulse volume recordings  Bilaterally.  EKG:     EKG 07/23/2020: Sinus rhythm with first-degree AV block at rate of 70 bpm, left atrial enlargement, left axis deviation.  Incomplete right bundle branch block.  Low-voltage complexes.  Pulmonary disease pattern.    Assessment     ICD-10-CM   1. Syncope and collapse  R55 EKG 12-Lead    LONG TERM MONITOR (3-14 DAYS)    [facility] ECHOCARDIOGRAM COMPLETE    [facility] CARDIAC STRESS TEST  2.  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 Elevated BP without diagnosis of hypertension  R03.0      Medications Discontinued During This Encounter  Medication Reason  . calcium carbonate (OS-CAL) 1250 (500 Ca) MG chewable tablet Completed Course    No orders of the defined types were placed in this encounter.  Orders Placed This Encounter  Procedures  . LONG TERM MONITOR (3-14 DAYS)    Standing Status:   Future    Number of Occurrences:   1    Standing Expiration Date:   07/23/2021    Order Specific Question:   Where should this test be performed?    Answer:   [facility]    Order Specific Question:   Does the patient have an implanted cardiac device?    Answer:   No    Order Specific Question:   Prescribed days of wear    Answer:   35    Order Specific Question:   Release to patient    Answer:   Immediate  . [facility] CARDIAC STRESS TEST    Standing Status:   Future    Standing Expiration Date:   09/20/2020  . EKG 12-Lead  . PCV ECHOCARDIOGRAM COMPLETE    Standing Status:   Future    Standing Expiration Date:   07/23/2021    Recommendations:   Morgan Bright is a 70 y.o. African-American female with no significant prior cardiovascular history, while sitting on a chair waiting for her friend at the [facility] on 07/11/2020 had sudden onset syncope. This lasted a few seconds, after which the patient had an episode of emesis and EMS was refused by patient and went home had another episode of emesis and  was then evaluated by PCP and referral made to me for evaluation of syncope.  She has recently been told to watch her blood pressure as it has been elevated recently.  Her brief episode of syncope that followed abdominal discomfort appears to be vasovagal. I will set up for a Event/Extended EKG monitor for 14 days. Explained how to use it and to activate the device. Will schedule for an echocardiogram. Schedule for routine treadmill stress test. Office visit following the work-up/investigations.   She also has history of seizure disorder diagnosed sometime in 1995 and was recommended antiseizure medications. She has now been referred to neurology and has an appointment coming up soon. I have recommended that she not drive for at least additional 3 months as she had one episode of grand mal seizure about 3 months ago sometime in September/October 2021.   Adrian Prows, MD, [facility] 07/23/2020, 3:23 PM Office: [phone]

## 2020-08-07 ENCOUNTER — Other Ambulatory Visit: Payer: Self-pay

## 2020-08-07 ENCOUNTER — Ambulatory Visit: Payer: Medicare PPO

## 2020-08-07 DIAGNOSIS — R55 Syncope and collapse: Secondary | ICD-10-CM

## 2020-08-18 ENCOUNTER — Other Ambulatory Visit: Payer: Self-pay

## 2020-08-18 ENCOUNTER — Ambulatory Visit: Payer: Medicare PPO

## 2020-08-18 DIAGNOSIS — R55 Syncope and collapse: Secondary | ICD-10-CM

## 2020-08-26 ENCOUNTER — Ambulatory Visit: Payer: Medicare Other

## 2020-08-28 ENCOUNTER — Other Ambulatory Visit: Payer: Self-pay

## 2020-08-28 ENCOUNTER — Encounter: Payer: Self-pay | Admitting: Cardiology

## 2020-08-28 ENCOUNTER — Ambulatory Visit: Payer: Medicare PPO | Admitting: Cardiology

## 2020-08-28 VITALS — BP 140/76 | HR 73 | Temp 98.2°F | Resp 18 | Ht 65.5 in | Wt 109.2 lb

## 2020-08-28 DIAGNOSIS — R55 Syncope and collapse: Secondary | ICD-10-CM

## 2020-08-28 DIAGNOSIS — R030 Elevated blood-pressure reading, without diagnosis of hypertension: Secondary | ICD-10-CM

## 2020-08-28 DIAGNOSIS — I351 Nonrheumatic aortic (valve) insufficiency: Secondary | ICD-10-CM

## 2020-08-28 DIAGNOSIS — G40409 Other generalized epilepsy and epileptic syndromes, not intractable, without status epilepticus: Secondary | ICD-10-CM

## 2020-08-28 NOTE — Progress Notes (Signed)
Primary Physician/Referring:  Bartholome Bill, MD  Patient ID: Morgan Bright, female    DOB: 1951/06/18, 70 y.o.   MRN: [id number]  Chief Complaint  Patient presents with  . Loss of Consciousness  . Follow-up    4 weeks    HPI:    Morgan Bright  is a 70 y.o.  African-American female with no significant prior cardiovascular history, while sitting on a chair waiting for her friend at the [facility] on 07/11/2020 had sudden onset syncope. This lasted a few seconds, after which the patient had an episode of emesis and EMS was refused by patient and went home had another episode of emesis and was then evaluated by PCP and referral made to me for evaluation of syncope.  I had seen her a month ago.  She underwent outpatient extended EKG monitoring, echocardiogram and treadmill stress test and presents for follow-up.   On further questioning, patient had abdominal discomfort prior to the episode of syncope.  However she also gives me history that in 1995 she has had 1 episode of grand mal seizure.  She was recommended antiseizure medications but she did not start.  She states that she has had few episodes of seizures occasionally and the last episode was 3 months ago  In Sept/Oct 2021 while in bed sleeping, she made a grunting noise that woke her husband up and also her sister-in-law in the next room and witnessed that she had rolled up her eyes and was unresponsive for a short time.  Prior to this her last episode was about 3 to 4 years ago.  Patient states that the episode that happened recently on 07/11/2020 was clearly different and she was aware of the surroundings after she woke up from brief fainting spell.  Otherwise she remains active, denies chest pain or dyspnea, she has not had any further episodes, I had last seen her a month ago.  Past Medical History:  Diagnosis Date  . ASD (atrial septal defect)    Past Surgical History:  Procedure Laterality Date  . ABDOMINAL  HYSTERECTOMY    . NECK SURGERY     Family History  Problem Relation Age of Onset  . Cancer Mother   . Hyperlipidemia Mother   . Hypertension Mother   . Breast cancer Mother 28  . Cancer Father   . Diabetes Father   . Cancer Brother   . Breast cancer Maternal Aunt        late 60's    Social History   Tobacco Use  . Smoking status: Never Smoker  . Smokeless tobacco: Never Used  Substance Use Topics  . Alcohol use: Yes    Alcohol/week: 12.0 standard drinks    Types: 12 Glasses of wine per week   Marital Status: Married  ROS  Review of Systems  Cardiovascular: Negative for chest pain, dyspnea on exertion, leg swelling and syncope.  Gastrointestinal: Negative for melena.  Neurological: Positive for seizures (First episode 1995).   Objective  Blood pressure 140/76, pulse 73, temperature 98.2 F (36.8 C), temperature source Temporal, resp. rate 18, height 5' 5.5" (1.664 m), weight 109 lb 3.2 oz (49.5 kg), SpO2 99 %.  Vitals with BMI 08/28/2020 07/23/2020 12/19/2012  Height 5' 5.5" 5' 5.5" 5' 5.5"  Weight 109 lbs 3 oz 109 lbs 120 lbs  BMI 17.89 35.36 14.4  Systolic 315 400 867  Diastolic 76 84 71  Pulse 73 74 66     Physical Exam Constitutional:  Comments: Petite  Cardiovascular:     Rate and Rhythm: Normal rate and regular rhythm.     Pulses: Intact distal pulses.     Heart sounds: Murmur heard.   Blowing decrescendo early diastolic murmur is present with a grade of 2/4 at the upper right sternal border. No gallop.      Comments: No leg edema, no JVD. Pulmonary:     Effort: Pulmonary effort is normal.     Breath sounds: Normal breath sounds.  Abdominal:     General: Bowel sounds are normal.     Palpations: Abdomen is soft.  Musculoskeletal:        General: Normal range of motion.  Skin:    General: Skin is warm and dry.  Neurological:     General: No focal deficit present.     Mental Status: She is alert and oriented to person, place, and time.     Laboratory examination:   External labs:   Labs 07/11/2020:  Sodium 141, potassium 3.4, BUN 12, creatinine 0.56, EGFR >60 mL, CMP otherwise normal.  Serum glucose 106 mg.  TSH normal.  A1c 4.8%.  Hb 12.5/HCT 37.6, WBC 6.0, platelets 252, microcytic indicis.  RBC folate and B12 normal.  Total cholesterol 243, triglycerides 118, HDL 117, LDL 102.  Non-HDL cholesterol 126.  Medications and allergies   Allergies  Allergen Reactions  . Benadryl [Diphenhydramine Hcl]      Current Meds  Medication Sig  . Cholecalciferol (D3-1000) 25 MCG (1000 UT) capsule Take 1,000 Units by mouth daily.  . cyanocobalamin 1000 MCG tablet Take by mouth.  . Multiple Vitamin (MULTI-VITAMIN) tablet Take 1 tablet by mouth daily.   Radiology:   No results found.  Cardiac Studies:   Segmental pressure lower extremity 06/19/2012: Findings: The right ankle brachial index is 1.04. The right toe  brachial index is 0.59. Triphasic Doppler waveforms in the right  femoral, right popliteal and right posterior tibial arteries.  Biphasic waveforms in the right dorsalis pedis artery.   The left ankle brachial index is 1.13. The left toe brachial index  is 0.54. There are normal triphasic Doppler waveforms throughout  the left lower extremity. Normal pulse volume recordings Bilaterally.  EKG:   Exercise treadmill stress test 08/18/2020: Exercise treadmill stress test performed using Bruce protocol.  Patient reached 4.6 METS, and 103% of age predicted maximum heart rate.  Exercise capacity was very low.  No chest pain reported.  Normal heart rate and hemodynamic response. Stress EKG revealed no ischemic changes. Clinical correlation recommended, given very low exercise capacity.   Echocardiogram 08/07/2020: Left ventricle cavity is normal in size and wall thickness. Normal global wall motion. Normal LV systolic function with EF 72%. Normal diastolic filling pattern.  Structurally normal trileaflet  aortic valve. No evidence of aortic stenosis. Moderate (Grade II) aortic regurgitation. No evidence of pulmonary hypertension.  Zio Patch Extended out patient EKG monitoring 14 days starting 07/23/2020: Patient had a min HR of 47 bpm, max HR of 135 bpm, and avg HR of 70 bpm. Predominant underlying rhythm was Sinus Rhythm. Isolated SVEs were rare (<1.0%), SVE Couplets were rare (<1.0%), and no SVE Triplets were present. Isolated VEs were rare (<1.0%),  and no VE Couplets or VE Triplets were present.  There was one episode of 2.0-second sinus pause at 10:43 PM.  No symptoms reported. No heart block or atrial fibrillation evident.  EKG:    EKG 07/23/2020: Sinus rhythm with first-degree AV block at rate of 70 bpm,  left atrial enlargement, left axis deviation.  Incomplete right bundle branch block.  Low-voltage complexes.  Pulmonary disease pattern.    Assessment     ICD-10-CM   1. Vasovagal syncope  R55   2. 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 Elevated BP without diagnosis of hypertension  R03.0   4. Moderate aortic regurgitation  I35.1 PCV ECHOCARDIOGRAM COMPLETE    There are no discontinued medications.  No orders of the defined types were placed in this encounter.  Orders Placed This Encounter  Procedures  . PCV ECHOCARDIOGRAM COMPLETE    Standing Status:   Future    Standing Expiration Date:   08/28/2021   Recommendations:   Gaige Fussner is a 70 y.o. African-American female with no significant prior cardiovascular history, while sitting on a chair waiting for her friend at the [facility] on 07/11/2020 had sudden onset syncope. This lasted a few seconds, after which the patient had an episode of emesis and EMS was refused by patient and went home had another episode of emesis and was then evaluated by PCP and referral made to me for evaluation of syncope.  She has recently been told to watch her blood pressure as it has been elevated recently.  Her brief episode  of syncope that followed abdominal discomfort appears to be vasovagal.  I do not suspect seizure disorder.  I reviewed the results of the extended EKG monitor and also echocardiogram and stress test.  She has markedly reduced exercise tolerance, regular exercise discussed with the patient.  With regard to elevated blood pressure she will continue to watch her blood pressure closely, she has been drinking at least 1 to 2 glasses of wine on a daily basis.  Advised her reducing alcohol will certainly improve blood pressure control.  With regard to aortic regurgitation, I will see her back in a year with repeat echocardiogram.  She has now been scheduled to see Dr. Marcial Pacas from neurologic standpoint for evaluation of seizure.     She also has history of seizure disorder diagnosed sometime in 1995 and was recommended antiseizure medications. She has now been referred to neurology and has an appointment coming up soon. I have recommended that she not drive for at least additional 3 months as she had one episode of grand mal seizure about 3 months ago sometime in September/October 2021.   Adrian Prows, MD, [facility] 08/28/2020, 3:23 PM Office: [phone] \ CC: Janet Berlin, MD (Neuro)

## 2020-09-01 ENCOUNTER — Encounter: Payer: Self-pay | Admitting: Neurology

## 2020-09-01 ENCOUNTER — Ambulatory Visit: Payer: Medicare PPO | Admitting: Neurology

## 2020-09-01 VITALS — BP 142/85 | HR 69 | Ht 65.5 in | Wt 110.0 lb

## 2020-09-01 DIAGNOSIS — R569 Unspecified convulsions: Secondary | ICD-10-CM | POA: Diagnosis not present

## 2020-09-01 MED ORDER — LEVETIRACETAM 500 MG PO TABS: 500.0000 mg | ORAL_TABLET | Freq: Two times a day (BID) | ORAL | 11 refills | Status: DC

## 2020-09-01 NOTE — Progress Notes (Signed)
Chief Complaint  Patient presents with   New Patient (Initial Visit)    She had a sudden loss of consciousness while in the lobby of a doctor's office in January of this year. The patient was getting post-colonoscopy instructions for her friend and the nurse reported that she suddenly "went out". She was diaphoretic upon regaining awareness. She vomited once while at the office and once when she returned home. EMS advised ED but the she declined. Unsure how long it lasted. No further events since this time. However, she reports having history of seizures. Not on any meds.    HISTORICAL  Morgan Bright is a 70 year old female, seen in request by her primary care physician Dr. Leavy Cella, Leanora Cover for evaluation of seizure, initial evaluation was on March 04, 2021  I reviewed and summarized the referring note.  She was diagnosed with seizure in [place] 1996, due to weakness the seizure episode, was prescribed anti-epileptic medications, but due to concern of medication side effect, she never took it  She only has occasionally recurrent generalized seizure spells,   2007 her husband witnessed the seizure episode while she was sitting at the passenger side,  January 2022, her husband heard a loud grunting sound when she was sleep, patient was found seizing, unresponsive, was helped to the bathroom, throw -up, did have headache afterwards  Few weeks later in late January 2022, patient was picking up her sister-in-law after her colonoscopy, while talking with physicians, she had a sudden onset of queasy sensation in her stomach, then passed out, EMS was advised, but patient was declined, she came around few minutes later,  Was seen by Dr. Jacinto Halim August 28, 2020, exercise treadmill stress test August 18, 2020, no ischemic changes, echocardiogram ejection fraction 72%, no structural abnormality  Extended outpatient 14 days EKG monitoring, no significant abnormality found  Lab Feb  2022, B12 240, CBC, Hg 12.5, CMP, Creat 0.56,     REVIEW OF SYSTEMS: Full 14 system review of systems performed and notable only for as above All other review of systems were negative.  ALLERGIES: Allergies  Allergen Reactions   Benadryl [Diphenhydramine Hcl] Other (See Comments)    Causes urinary difficulty.     HOME MEDICATIONS: Current Outpatient Medications  Medication Sig Dispense Refill   cyanocobalamin 1000 MCG tablet Take 1,000 mcg by mouth daily.     Magnesium 250 MG TABS Take 1 tablet by mouth daily.     Multiple Vitamin (MULTI-VITAMIN) tablet Take 1 tablet by mouth daily.     Omega-3 Fatty Acids (OMEGA 3 PO) Take 1 tablet by mouth daily.     VITAMIN D PO Take 50 mcg by mouth daily.     VITAMIN E PO Take 150 mg by mouth daily.     No current facility-administered medications for this visit.    PAST MEDICAL HISTORY: Past Medical History:  Diagnosis Date   ASD (atrial septal defect)    History of seizure    Syncope and collapse     PAST SURGICAL HISTORY: Past Surgical History:  Procedure Laterality Date   ABDOMINAL HYSTERECTOMY     NECK SURGERY      FAMILY HISTORY: Family History  Problem Relation Age of Onset   Hyperlipidemia Mother    Hypertension Mother    Breast cancer Mother 81   Diabetes Father    Prostate cancer Father    Cancer Brother    Breast cancer Maternal Aunt  late 75's    SOCIAL HISTORY: Social History   Socioeconomic History   Marital status: Married    Spouse name: Not on file   Number of children: 0   Years of education: two years of college   Highest education level: Not on file  Occupational History   Occupation: Retired  Tobacco Use   Smoking status: Former Smoker   Smokeless tobacco: Never Used   Tobacco comment: smoked for 6 months, 20+ yrs ago  Substance and Sexual Activity   Alcohol use: Yes    Comment: 1-2 glasses of wine per day   Drug use: No   Sexual activity: Not on  file  Other Topics Concern   Not on file  Social History Narrative   Lives at home with her husband.   Right-handed.   One cup caffeine daily.   Social Determinants of Health   Financial Resource Strain: Not on file  Food Insecurity: Not on file  Transportation Needs: Not on file  Physical Activity: Not on file  Stress: Not on file  Social Connections: Not on file  Intimate Partner Violence: Not on file     PHYSICAL EXAM   Vitals:   09/01/20 1040  BP: (!) 142/85  Pulse: 69  Weight: 110 lb (49.9 kg)  Height: 5' 5.5" (1.664 m)   Not recorded     Body mass index is 18.03 kg/m.  PHYSICAL EXAMNIATION:  Gen: NAD, conversant, well nourised, well groomed                     Cardiovascular: Regular rate rhythm, no peripheral edema, warm, nontender. Eyes: Conjunctivae clear without exudates or hemorrhage Neck: Supple, no carotid bruits. Pulmonary: Clear to auscultation bilaterally   NEUROLOGICAL EXAM:  MENTAL STATUS: Speech:    Speech is normal; fluent and spontaneous with normal comprehension.  Cognition:     Orientation to time, place and person     Normal recent and remote memory     Normal Attention span and concentration     Normal Language, naming, repeating,spontaneous speech     Fund of knowledge   CRANIAL NERVES: CN II: Visual fields are full to confrontation. Pupils are round equal and briskly reactive to light. CN III, IV, VI: extraocular movement are normal. No ptosis. CN V: Facial sensation is intact to light touch CN VII: Face is symmetric with normal eye closure  CN VIII: Hearing is normal to causal conversation. CN IX, X: Phonation is normal. CN XI: Head turning and shoulder shrug are intact  MOTOR: There is no pronator drift of out-stretched arms. Muscle bulk and tone are normal. Muscle strength is normal.  REFLEXES: Reflexes are 2+ and symmetric at the biceps, triceps, knees, and ankles. Plantar responses are flexor.  SENSORY: Intact to  light touch, pinprick and vibratory sensation are intact in fingers and toes.  COORDINATION: There is no trunk or limb dysmetria noted.  GAIT/STANCE: Posture is normal. Gait is steady with normal steps, base, arm swing, and turning. Heel and toe walking are normal. Tandem gait is normal.  Romberg is absent.   DIAGNOSTIC DATA (LABS, IMAGING, TESTING) - I reviewed patient records, labs, notes, testing and imaging myself where available.   ASSESSMENT AND PLAN  Morgan Bright is a 70 y.o. female   Epilepsy  Most recent event was in July 15, 2020  Complete evaluation with MRI of the brain with without contrast  EEG  Start Keppra 500 mg twice daily  No  driving until episode free for 6 months   Levert Feinstein, M.D. Ph.D.  [facility] [address] [place], [place] [address] Ph: [phone] Fax: [phone]  CC:  Verlon Au, MD [address] [place],  [place] [address]

## 2020-09-02 ENCOUNTER — Encounter: Payer: Self-pay | Admitting: Neurology

## 2020-09-03 ENCOUNTER — Telehealth: Payer: Self-pay | Admitting: Neurology

## 2020-09-03 NOTE — Telephone Encounter (Signed)
MRI Brain w/wo contrast  [employer] Berkley Harvey: [id number] exp: 09/03/20-10/03/20 sent to GI for scheduling

## 2020-09-13 ENCOUNTER — Ambulatory Visit
Admission: RE | Admit: 2020-09-13 | Discharge: 2020-09-13 | Disposition: A | Discharge status: Home / Self Care | Payer: Medicare PPO | Source: Ambulatory Visit | Attending: Neurology | Admitting: Neurology

## 2020-09-13 ENCOUNTER — Other Ambulatory Visit: Payer: Self-pay

## 2020-09-13 DIAGNOSIS — R569 Unspecified convulsions: Secondary | ICD-10-CM

## 2020-09-13 MED ORDER — GADOBENATE DIMEGLUMINE 529 MG/ML IV SOLN
9.0000 mL | Freq: Once | INTRAVENOUS | Status: AC | PRN
  Administered 2020-09-13: 9 mL via INTRAVENOUS

## 2020-09-15 ENCOUNTER — Telehealth: Payer: Self-pay | Admitting: Neurology

## 2020-09-15 NOTE — Telephone Encounter (Signed)
I spoke to the patient. She verbalized understanding of the findings. She will start the recommended aspirin 81mg , one tablet daily. She will keep her pending follow up for further discussion of the results.

## 2020-09-15 NOTE — Telephone Encounter (Signed)
  IMPRESSION: Slightly abnormal MRI scan of the brain with and without contrast showing age-related changes of chronic small vessel disease.  These appear expectedly progressed compared with previous CT head dated 10/08/2004  Please call patient, MRI of the brain showed significant supratentorium small vessel disease, will review films at her next follow-up visit  Please advise her to take aspirin 81 mg daily

## 2020-09-29 ENCOUNTER — Ambulatory Visit: Payer: Medicare PPO | Admitting: Neurology

## 2020-09-29 DIAGNOSIS — R569 Unspecified convulsions: Secondary | ICD-10-CM | POA: Diagnosis not present

## 2020-10-14 ENCOUNTER — Ambulatory Visit: Payer: Medicare PPO

## 2020-10-14 NOTE — Procedures (Signed)
   HISTORY: 70 year old female, presenting with sudden loss of consciousness  TECHNIQUE:  This is a routine 16 channel EEG recording with one channel devoted to a limited EKG recording.  It was performed during wakefulness, drowsiness and asleep.  Hyperventilation and photic stimulation were performed as activating procedures.  There are minimum muscle and movement artifact noted.  Upon maximum arousal, posterior dominant waking rhythm consistent of rhythmic alpha range activity, with frequency of 9 hz. Activities are symmetric over the bilateral posterior derivations and attenuated with eye opening.  Hyperventilation produced mild/moderate buildup with higher amplitude and the slower activities noted.  Photic stimulation did not alter the tracing.  During EEG recording, patient developed drowsiness and no deeper stage of sleep was achieved  During EEG recording, there was no epileptiform discharge noted.  EKG demonstrate sinus rhythm, with heart rate of 52 bpm  CONCLUSION: This is a  normal awake EEG.  There is no electrodiagnostic evidence of epileptiform discharge.  Levert Feinstein, M.D. Ph.D.  [facility] [address] [place], [place] [address] Phone: [phone] Fax:      [phone]

## 2020-10-29 ENCOUNTER — Other Ambulatory Visit: Payer: Self-pay

## 2020-10-29 ENCOUNTER — Ambulatory Visit
Admission: RE | Admit: 2020-10-29 | Discharge: 2020-10-29 | Disposition: A | Discharge status: Home / Self Care | Payer: Medicare PPO | Source: Ambulatory Visit | Attending: Family Medicine | Admitting: Family Medicine

## 2020-10-29 DIAGNOSIS — Z1231 Encounter for screening mammogram for malignant neoplasm of breast: Secondary | ICD-10-CM

## 2021-03-05 ENCOUNTER — Ambulatory Visit: Payer: Medicare PPO | Admitting: Neurology

## 2021-08-25 ENCOUNTER — Other Ambulatory Visit: Payer: Self-pay | Admitting: Cardiology

## 2021-08-25 DIAGNOSIS — I351 Nonrheumatic aortic (valve) insufficiency: Secondary | ICD-10-CM

## 2021-08-28 ENCOUNTER — Other Ambulatory Visit: Payer: Medicare PPO

## 2021-09-03 ENCOUNTER — Ambulatory Visit: Payer: Medicare PPO

## 2021-09-03 ENCOUNTER — Other Ambulatory Visit: Payer: Self-pay

## 2021-09-03 DIAGNOSIS — I351 Nonrheumatic aortic (valve) insufficiency: Secondary | ICD-10-CM

## 2021-09-04 ENCOUNTER — Ambulatory Visit: Payer: Medicare PPO | Admitting: Cardiology

## 2021-09-18 ENCOUNTER — Encounter: Payer: Self-pay | Admitting: Cardiology

## 2021-09-18 ENCOUNTER — Other Ambulatory Visit: Payer: Self-pay

## 2021-09-18 ENCOUNTER — Ambulatory Visit: Payer: Medicare PPO | Admitting: Cardiology

## 2021-09-18 VITALS — BP 166/89 | HR 69 | Temp 98.0°F | Resp 20 | Ht 65.5 in | Wt 109.8 lb

## 2021-09-18 DIAGNOSIS — R55 Syncope and collapse: Secondary | ICD-10-CM

## 2021-09-18 DIAGNOSIS — I351 Nonrheumatic aortic (valve) insufficiency: Secondary | ICD-10-CM

## 2021-09-18 DIAGNOSIS — I10 Essential (primary) hypertension: Secondary | ICD-10-CM

## 2021-09-18 MED ORDER — NIFEDIPINE ER OSMOTIC RELEASE 60 MG PO TB24: 60.0000 mg | ORAL_TABLET | Freq: Every day | ORAL | 2 refills | Status: DC

## 2021-09-18 NOTE — Progress Notes (Signed)
EKG 09/18/2021: Sinus rhythm with first-degree block at the rate of 67 bpm, leftward enlargement, left axis deviation, left anterior fascicular block.  Cannot exclude inferior infarct old.  Incomplete right bundle branch block.  Low-voltage complexes.

## 2021-09-18 NOTE — Progress Notes (Signed)
? ?Primary Physician/Referring:  Bartholome Bill, MD ? ?Patient ID: Morgan Bright, female    DOB: 1951-02-23, 71 y.o.   MRN: [id number] ? ?Chief Complaint  ?Patient presents with  ? Aortic regurgitation   ? Hypertension  ? Follow-up  ?  1 year  ? ?HPI:   ? ?Morgan Bright  is a 71 y.o.  African-American female with no significant prior cardiovascular history, while sitting on a chair waiting for her friend at the [facility] on 07/11/2020 had sudden onset syncope.  Syncopal episode felt to be vasovagal. ? ?She has noticed elevated blood pressure both at home and also had noticed her blood pressure to be high on our previous office visit.  ? ?She also gives me history that in 1995 she has had 1 episode of grand mal seizure.  She was recommended antiseizure medications but she did not start.  Last episode was sometime in July 2022, no recurrence since.   ? ?Otherwise she remains active, denies chest pain or dyspnea, she has not had any further episodes, I had last seen her a month ago. ? ?Past Medical History:  ?Diagnosis Date  ? ASD (atrial septal defect)   ? History of seizure   ? Syncope and collapse   ? ?Social History  ? ?Tobacco Use  ? Smoking status: Former  ? Smokeless tobacco: Never  ? Tobacco comments:  ?  smoked for 6 months, 20+ yrs ago  ?Substance Use Topics  ? Alcohol use: Yes  ?  Comment: 1-2 glasses of wine per day  ? ?Marital Status: Married  ?ROS  ?Review of Systems  ?Cardiovascular:  Negative for chest pain, dyspnea on exertion, leg swelling and syncope.  ?Gastrointestinal:  Negative for melena.  ?Neurological:  Positive for seizures (First episode 1995).  ?Objective  ?Blood pressure (!) 166/89, pulse 69, temperature 98 ?F (36.7 ?C), temperature source Temporal, resp. rate 20, height 5' 5.5" (1.664 m), weight 109 lb 12.8 oz (49.8 kg).  ? ?  09/18/2021  ? 10:42 AM 09/01/2020  ? 10:40 AM 08/28/2020  ?  2:25 PM  ?Vitals with BMI  ?Height 5' 5.5" 5' 5.5" 5' 5.5"  ?Weight 109 lbs 13 oz 110 lbs  109 lbs 3 oz  ?BMI 17.99 18.02 17.89  ?Systolic 625 638 937  ?Diastolic 89 85 76  ?Pulse 69 69 73  ?  ? Physical Exam ?Constitutional:   ?   Comments: Petite  ?Cardiovascular:  ?   Rate and Rhythm: Normal rate and regular rhythm.  ?   Pulses: Intact distal pulses.  ?   Heart sounds: Murmur heard.  ?Blowing decrescendo early diastolic murmur is present with a grade of 2/4 at the upper right sternal border.  ?  No gallop.  ?   Comments: No leg edema, no JVD. ?Pulmonary:  ?   Effort: Pulmonary effort is normal.  ?   Breath sounds: Normal breath sounds.  ?Abdominal:  ?   General: Bowel sounds are normal.  ?   Palpations: Abdomen is soft.  ?Musculoskeletal:     ?   General: Normal range of motion.  ?Skin: ?   General: Skin is warm and dry.  ?Neurological:  ?   General: No focal deficit present.  ?   Mental Status: She is alert and oriented to person, place, and time.  ? ?Laboratory examination:  ? ?External labs:  ? ?Labs 07/11/2020: ? ?Sodium 141, potassium 3.4, BUN 12, creatinine 0.56, EGFR >60 mL, CMP otherwise normal.  Serum glucose 106 mg. ? ?TSH normal.  A1c 4.8%. ? ?Hb 12.5/HCT 37.6, WBC 6.0, platelets 252, microcytic indicis.  RBC folate and B12 normal. ? ?Total cholesterol 243, triglycerides 118, HDL 117, LDL 102.  Non-HDL cholesterol 126. ? ?Medications and allergies  ? ?Allergies  ?Allergen Reactions  ? Benadryl [Diphenhydramine Hcl] Other (See Comments)  ?  Causes urinary difficulty.   ?  ? ?Current Meds  ?Medication Sig  ? aspirin EC 81 MG tablet Take 81 mg by mouth daily. Swallow whole.  ? cyanocobalamin 1000 MCG tablet Take 1,000 mcg by mouth daily.  ? Magnesium 250 MG TABS Take 1 tablet by mouth daily.  ? Multiple Vitamin (MULTI-VITAMIN) tablet Take 1 tablet by mouth daily.  ? NIFEdipine (PROCARDIA XL) 60 MG 24 hr tablet Take 1 tablet (60 mg total) by mouth daily.  ? Omega-3 Fatty Acids (OMEGA 3 PO) Take 1 tablet by mouth daily.  ? VITAMIN D PO Take 50 mcg by mouth daily.  ? VITAMIN E PO Take 150 mg by  mouth daily.  ? ?Radiology:  ? ?No results found. ? ?Cardiac Studies:  ? ?Segmental pressure lower extremity 06/19/2012: ?Findings: The right ankle brachial index is 1.04.  The right toe  ?brachial index is 0.59.  Triphasic Doppler waveforms in the right  ?femoral, right popliteal and right posterior tibial arteries.  ?Biphasic waveforms in the right dorsalis pedis artery.  ? ?The left ankle brachial index is 1.13.  The left toe brachial index  ?is 0.54.  There are normal triphasic Doppler waveforms throughout  ?the left lower extremity.  Normal pulse volume recordings Bilaterally. ? ?EKG:  ? ?Exercise treadmill stress test 08/18/2020: ?Exercise treadmill stress test performed using Bruce protocol.  Patient reached 4.6 METS, and 103% of age predicted maximum heart rate.  Exercise capacity was very low.  No chest pain reported.  Normal heart rate and hemodynamic response. Stress EKG revealed no ischemic changes. ?Clinical correlation recommended, given very low exercise capacity.  ? ?Echocardiogram 08/07/2020: ?Left ventricle cavity is normal in size and wall thickness. Normal global wall motion. Normal LV systolic function with EF 72%. Normal diastolic filling pattern.  ?Structurally normal trileaflet aortic valve. No evidence of aortic stenosis. Moderate (Grade II) aortic regurgitation. ?No evidence of pulmonary hypertension. ? ?Zio Patch Extended out patient EKG monitoring 14 days starting 07/23/2020: ?Patient had a min HR of 47 bpm, max HR of 135 bpm, and avg HR of 70 bpm. Predominant underlying rhythm was Sinus Rhythm. Isolated SVEs were rare (<1.0%), SVE Couplets were rare (<1.0%), and no SVE Triplets were present. Isolated VEs were rare (<1.0%),  ?and no VE Couplets or VE Triplets were present.  ?There was one episode of 2.0-second sinus pause at 10:43 PM.  No symptoms reported. No heart block or atrial fibrillation evident. ? ?EKG: ?EKG 09/18/2021: Sinus rhythm with first-degree block at the rate of 67 bpm,  leftward enlargement, left axis deviation, left anterior fascicular block.  Cannot exclude inferior infarct old.  Incomplete right bundle branch block.  Low-voltage complexes.  No significant change from 07/23/2020.  ? ?Assessment  ? ?  ICD-10-CM   ?1. Vasovagal syncope  R55   ?  ?2. Primary hypertension  I10 EKG 12-Lead  ?  NIFEdipine (PROCARDIA XL) 60 MG 24 hr tablet  ?  ?3. Moderate aortic regurgitation  I35.1 NIFEdipine (PROCARDIA XL) 60 MG 24 hr tablet  ?  PCV ECHOCARDIOGRAM COMPLETE  ?  ?  ?Medications Discontinued During This Encounter  ?Medication Reason  ?  levETIRAcetam (KEPPRA) 500 MG tablet   ?  ?Meds ordered this encounter  ?Medications  ? NIFEdipine (PROCARDIA XL) 60 MG 24 hr tablet  ?  Sig: Take 1 tablet (60 mg total) by mouth daily.  ?  Dispense:  30 tablet  ?  Refill:  2  ? ?Orders Placed This Encounter  ?Procedures  ? EKG 12-Lead  ? PCV ECHOCARDIOGRAM COMPLETE  ?  Standing Status:   Future  ?  Standing Expiration Date:   09/19/2022  ? ?Recommendations:  ? ?Morgan Bright is a 71 y.o. African-American female with no significant prior cardiovascular history, while sitting on a chair waiting for her friend at the [facility] on 07/11/2020 had sudden onset syncope.  Syncopal episode felt to be vasovagal. ? ?She has noticed elevated blood pressure both at home and also had noticed her blood pressure to be high on our previous office visit.  She probably has primary hypertension as well.  In view of primary hypertension and also for aortic regurgitation, I started her on Procardia XL 60 mg daily.  Side effects of leg edema discussed with the patient.  She will continue to monitor her blood pressure closely at home, goal blood pressure 130/80 mmHg or less.  Patient will contact us if she needs refills and if she is tolerating the medication. ? ?Otherwise stable from cardiac standpoint, no clinical evidence of heart failure, she has not had any recurrence of syncope since last office visit a month ago.   I will repeat echocardiogram in a year prior to her next office visit.  ? ? ?Morgan Prows, MD, [facility] ?09/18/2021, 11:15 AM ?Office: [phone] ?\ ?CC: Janet Berlin, MD (Neuro) ?

## 2021-09-28 ENCOUNTER — Other Ambulatory Visit: Payer: Self-pay | Admitting: Family Medicine

## 2021-09-28 DIAGNOSIS — Z1231 Encounter for screening mammogram for malignant neoplasm of breast: Secondary | ICD-10-CM

## 2021-10-30 ENCOUNTER — Ambulatory Visit
Admission: RE | Admit: 2021-10-30 | Discharge: 2021-10-30 | Disposition: A | Discharge status: Home / Self Care | Payer: Medicare PPO | Source: Ambulatory Visit | Attending: Family Medicine | Admitting: Family Medicine

## 2021-10-30 DIAGNOSIS — Z1231 Encounter for screening mammogram for malignant neoplasm of breast: Secondary | ICD-10-CM

## 2021-12-14 ENCOUNTER — Other Ambulatory Visit: Payer: Self-pay | Admitting: Cardiology

## 2021-12-14 DIAGNOSIS — I10 Essential (primary) hypertension: Secondary | ICD-10-CM

## 2021-12-14 DIAGNOSIS — I351 Nonrheumatic aortic (valve) insufficiency: Secondary | ICD-10-CM

## 2022-09-17 ENCOUNTER — Other Ambulatory Visit: Payer: Medicare PPO

## 2022-09-29 ENCOUNTER — Ambulatory Visit: Payer: Self-pay | Admitting: Cardiology

## 2022-09-29 NOTE — Progress Notes (Signed)
Primary Physician/Referring:  Morgan Bill, MD  Patient ID: Morgan Bright, female    DOB: [birth year], 72 y.o.   MRN: [id number]  No chief complaint on file.  HPI:    Morgan Bright  is a 72 y.o.  African-American female with no significant prior cardiovascular history, while sitting on a chair waiting for her friend at the [facility] on 07/11/2020 had sudden onset syncope.  Syncopal episode felt to be vasovagal.  She has noticed elevated blood pressure both at home and also had noticed her blood pressure to be high on our previous office visit.   She also gives me history that in 1995 she has had 1 episode of grand mal seizure.  She was recommended antiseizure medications but she did not start.  Last episode was sometime in July 2022, no recurrence since.    Otherwise she remains active, denies chest pain or dyspnea, she has not had any further episodes, I had last seen her a month ago.  Past Medical History:  Diagnosis Date   ASD (atrial septal defect)    History of seizure    Syncope and collapse    Social History   Tobacco Use   Smoking status: Former   Smokeless tobacco: Never   Tobacco comments:    smoked for 6 months, 20+ yrs ago  Substance Use Topics   Alcohol use: Yes    Comment: 1-2 glasses of wine per day   Marital Status: Married  ROS  Review of Systems  Cardiovascular:  Negative for chest pain, dyspnea on exertion, leg swelling and syncope.  Gastrointestinal:  Negative for melena.  Neurological:  Positive for seizures (First episode 1995).   Objective  There were no vitals taken for this visit.     09/18/2021   10:42 AM 09/01/2020   10:40 AM 08/28/2020    2:25 PM  Vitals with BMI  Height 5' 5.5" 5' 5.5" 5' 5.5"  Weight 109 lbs 13 oz 110 lbs 109 lbs 3 oz  BMI 17.99 [id number]  Systolic [id number]  Diastolic 89 85 76  Pulse 69 69 73     Physical Exam Constitutional:      Comments: Petite  Cardiovascular:     Rate and Rhythm:  Normal rate and regular rhythm.     Pulses: Intact distal pulses.     Heart sounds: Murmur heard.     Blowing decrescendo early diastolic murmur is present with a grade of 2/4 at the upper right sternal border.     No gallop.     Comments: No leg edema, no JVD. Pulmonary:     Effort: Pulmonary effort is normal.     Breath sounds: Normal breath sounds.  Abdominal:     General: Bowel sounds are normal.     Palpations: Abdomen is soft.  Musculoskeletal:        General: Normal range of motion.  Skin:    General: Skin is warm and dry.  Neurological:     General: No focal deficit present.     Mental Status: She is alert and oriented to person, place, and time.    Laboratory examination:   External labs:   Labs 07/11/2020:  Sodium 141, potassium 3.4, BUN 12, creatinine 0.56, EGFR >60 mL, CMP otherwise normal.  Serum glucose 106 mg.  TSH normal.  A1c 4.8%.  Hb 12.5/HCT 37.6, WBC 6.0, platelets 252, microcytic indicis.  RBC folate and B12 normal.  Total cholesterol 243, triglycerides 118,  HDL 117, LDL 102.  Non-HDL cholesterol 126.  Radiology:   No results found.  Cardiac Studies:   Segmental pressure lower extremity 06/19/2012: Findings: The right ankle brachial index is 1.04.  The right toe  brachial index is 0.59.  Triphasic Doppler waveforms in the right  femoral, right popliteal and right posterior tibial arteries.  Biphasic waveforms in the right dorsalis pedis artery.   The left ankle brachial index is 1.13.  The left toe brachial index  is 0.54.  There are normal triphasic Doppler waveforms throughout  the left lower extremity.  Normal pulse volume recordings Bilaterally.  EKG:   Exercise treadmill stress test 08/18/2020: Exercise treadmill stress test performed using Bruce protocol.  Patient reached 4.6 METS, and 103% of age predicted maximum heart rate.  Exercise capacity was very low.  No chest pain reported.  Normal heart rate and hemodynamic response. Stress  EKG revealed no ischemic changes. Clinical correlation recommended, given very low exercise capacity.   Echocardiogram 08/07/2020: Left ventricle cavity is normal in size and wall thickness. Normal global wall motion. Normal LV systolic function with EF 72%. Normal diastolic filling pattern.  Structurally normal trileaflet aortic valve. No evidence of aortic stenosis. Moderate (Grade II) aortic regurgitation. No evidence of pulmonary hypertension.  Zio Patch Extended out patient EKG monitoring 14 days starting 07/23/2020: Patient had a min HR of 47 bpm, max HR of 135 bpm, and avg HR of 70 bpm. Predominant underlying rhythm was Sinus Rhythm. Isolated SVEs were rare (<1.0%), SVE Couplets were rare (<1.0%), and no SVE Triplets were present. Isolated VEs were rare (<1.0%),  and no VE Couplets or VE Triplets were present.  There was one episode of 2.0-second sinus pause at 10:43 PM.  No symptoms reported. No heart block or atrial fibrillation evident.  PCV ECHOCARDIOGRAM COMPLETE 09/03/2021  Narrative Echocardiogram 09/03/2021: Normal LV systolic function with visual EF 60-65%. Left ventricle cavity is normal in size. Normal left ventricular wall thickness. Normal global wall motion. Normal diastolic filling pattern, normal LAP. Trace aortic regurgitation. Mild tricuspid regurgitation. No evidence of pulmonary hypertension. Compared to study 08/07/2020 moderate AR is now trace otherwise no significant change.   EKG: EKG 09/18/2021: Sinus rhythm with first-degree block at the rate of 67 bpm, leftward enlargement, left axis deviation, left anterior fascicular block.  Cannot exclude inferior infarct old.  Incomplete right bundle branch block.  Low-voltage complexes.  No significant change from 07/23/2020.    Medications and allergies   Allergies  Allergen Reactions   Benadryl [Diphenhydramine Hcl] Other (See Comments)    Causes urinary difficulty.      Current Outpatient Medications:    aspirin  EC 81 MG tablet, Take 81 mg by mouth daily. Swallow whole., Disp: , Rfl:    cyanocobalamin 1000 MCG tablet, Take 1,000 mcg by mouth daily., Disp: , Rfl:    Magnesium 250 MG TABS, Take 1 tablet by mouth daily., Disp: , Rfl:    Multiple Vitamin (MULTI-VITAMIN) tablet, Take 1 tablet by mouth daily., Disp: , Rfl:    NIFEdipine (PROCARDIA XL/NIFEDICAL XL) 60 MG 24 hr tablet, TAKE 1 TABLET(60 MG) BY MOUTH DAILY, Disp: 30 tablet, Rfl: 2   Omega-3 Fatty Acids (OMEGA 3 PO), Take 1 tablet by mouth daily., Disp: , Rfl:    VITAMIN D PO, Take 50 mcg by mouth daily., Disp: , Rfl:    VITAMIN E PO, Take 150 mg by mouth daily., Disp: , Rfl:    Assessment     ICD-10-CM  1. Moderate aortic regurgitation  I35.1     2. Primary hypertension  I10       There are no discontinued medications.   No orders of the defined types were placed in this encounter.  No orders of the defined types were placed in this encounter.  Recommendations:   Morgan Bright is a 72 y.o. African-American female with no significant prior cardiovascular history, while sitting on a chair waiting for her friend at the [facility] on 07/11/2020 had sudden onset syncope.  Syncopal episode felt to be vasovagal.  She has noticed elevated blood pressure both at home and also had noticed her blood pressure to be high on our previous office visit.  She probably has primary hypertension as well.  In view of primary hypertension and also for aortic regurgitation, I started her on Procardia XL 60 mg daily.  Side effects of leg edema discussed with the patient.  She will continue to monitor her blood pressure closely at home, goal blood pressure 130/80 mmHg or less.  Patient will contact us if she needs refills and if she is tolerating the medication.  Otherwise stable from cardiac standpoint, no clinical evidence of heart failure, she has not had any recurrence of syncope since last office visit a month ago.  I will repeat echocardiogram in a  year prior to her next office visit.    Adrian Prows, MD, [facility] 09/29/2022, 8:08 AM Office: [phone] \ CC: Janet Berlin, MD (Neuro)

## 2022-09-30 ENCOUNTER — Other Ambulatory Visit: Payer: Self-pay | Admitting: Family Medicine

## 2022-09-30 DIAGNOSIS — Z1231 Encounter for screening mammogram for malignant neoplasm of breast: Secondary | ICD-10-CM

## 2022-11-16 ENCOUNTER — Ambulatory Visit
Admission: RE | Admit: 2022-11-16 | Discharge: 2022-11-16 | Disposition: A | Discharge status: Home / Self Care | Payer: Medicare PPO | Source: Ambulatory Visit | Attending: Family Medicine | Admitting: Family Medicine

## 2022-11-16 DIAGNOSIS — Z1231 Encounter for screening mammogram for malignant neoplasm of breast: Secondary | ICD-10-CM

## 2023-10-24 ENCOUNTER — Other Ambulatory Visit: Payer: Self-pay | Admitting: Family Medicine

## 2023-10-24 DIAGNOSIS — Z1231 Encounter for screening mammogram for malignant neoplasm of breast: Secondary | ICD-10-CM

## 2023-11-22 ENCOUNTER — Ambulatory Visit

## 2023-11-28 ENCOUNTER — Other Ambulatory Visit: Payer: Self-pay | Admitting: Family Medicine

## 2023-11-28 ENCOUNTER — Ambulatory Visit
Admission: RE | Admit: 2023-11-28 | Discharge: 2023-11-28 | Disposition: A | Discharge status: Home / Self Care | Source: Ambulatory Visit | Attending: Family Medicine | Admitting: Family Medicine

## 2023-11-28 DIAGNOSIS — Z1231 Encounter for screening mammogram for malignant neoplasm of breast: Secondary | ICD-10-CM

## 2023-11-28 DIAGNOSIS — N6459 Other signs and symptoms in breast: Secondary | ICD-10-CM

## 2023-12-07 ENCOUNTER — Ambulatory Visit
Admission: RE | Admit: 2023-12-07 | Discharge: 2023-12-07 | Disposition: A | Discharge status: Home / Self Care | Source: Ambulatory Visit | Attending: Family Medicine | Admitting: Family Medicine

## 2023-12-07 DIAGNOSIS — N6459 Other signs and symptoms in breast: Secondary | ICD-10-CM

## 2023-12-26 ENCOUNTER — Other Ambulatory Visit

## 2023-12-26 ENCOUNTER — Encounter
